# Patient Record
Sex: Male | Born: 1965 | Race: Black or African American | Hispanic: No | Marital: Single | Smoking: Never smoker
Health system: Southern US, Community
[De-identification: ages and names within clinical notes are randomized; demographics above are authoritative.]

## PROBLEM LIST (undated history)

## (undated) DIAGNOSIS — E7800 Pure hypercholesterolemia, unspecified: Secondary | ICD-10-CM

## (undated) DIAGNOSIS — G4730 Sleep apnea, unspecified: Secondary | ICD-10-CM

## (undated) DIAGNOSIS — I10 Essential (primary) hypertension: Secondary | ICD-10-CM

## (undated) DIAGNOSIS — E079 Disorder of thyroid, unspecified: Secondary | ICD-10-CM

## (undated) DIAGNOSIS — M51369 Other intervertebral disc degeneration, lumbar region without mention of lumbar back pain or lower extremity pain: Secondary | ICD-10-CM

## (undated) DIAGNOSIS — M5136 Other intervertebral disc degeneration, lumbar region: Secondary | ICD-10-CM

## (undated) HISTORY — DX: Morbid (severe) obesity due to excess calories: E66.01

---

## 2016-05-19 ENCOUNTER — Emergency Department
Admission: EM | Admit: 2016-05-19 | Discharge: 2016-05-19 | Disposition: A | Discharge status: Home / Self Care | Payer: Self-pay | Source: Home / Self Care | Attending: Family Medicine | Admitting: Family Medicine

## 2016-05-19 ENCOUNTER — Encounter: Payer: Self-pay | Admitting: Emergency Medicine

## 2016-05-19 ENCOUNTER — Emergency Department (INDEPENDENT_AMBULATORY_CARE_PROVIDER_SITE_OTHER): Payer: Self-pay

## 2016-05-19 DIAGNOSIS — M25471 Effusion, right ankle: Secondary | ICD-10-CM

## 2016-05-19 DIAGNOSIS — M25571 Pain in right ankle and joints of right foot: Secondary | ICD-10-CM

## 2016-05-19 DIAGNOSIS — M79671 Pain in right foot: Secondary | ICD-10-CM

## 2016-05-19 DIAGNOSIS — Z8739 Personal history of other diseases of the musculoskeletal system and connective tissue: Secondary | ICD-10-CM

## 2016-05-19 DIAGNOSIS — M7989 Other specified soft tissue disorders: Secondary | ICD-10-CM

## 2016-05-19 DIAGNOSIS — Z760 Encounter for issue of repeat prescription: Secondary | ICD-10-CM

## 2016-05-19 DIAGNOSIS — Z8639 Personal history of other endocrine, nutritional and metabolic disease: Secondary | ICD-10-CM

## 2016-05-19 DIAGNOSIS — M79674 Pain in right toe(s): Secondary | ICD-10-CM

## 2016-05-19 DIAGNOSIS — Z8679 Personal history of other diseases of the circulatory system: Secondary | ICD-10-CM

## 2016-05-19 HISTORY — DX: Other intervertebral disc degeneration, lumbar region: M51.36

## 2016-05-19 HISTORY — DX: Disorder of thyroid, unspecified: E07.9

## 2016-05-19 HISTORY — DX: Other intervertebral disc degeneration, lumbar region without mention of lumbar back pain or lower extremity pain: M51.369

## 2016-05-19 HISTORY — DX: Essential (primary) hypertension: I10

## 2016-05-19 HISTORY — DX: Pure hypercholesterolemia, unspecified: E78.00

## 2016-05-19 HISTORY — DX: Sleep apnea, unspecified: G47.30

## 2016-05-19 MED ORDER — VERAPAMIL HCL ER 240 MG PO TBCR: 240.0000 mg | EXTENDED_RELEASE_TABLET | Freq: Every day | ORAL | 0 refills | Status: DC

## 2016-05-19 MED ORDER — INDOMETHACIN 25 MG PO CAPS: 25.0000 mg | ORAL_CAPSULE | Freq: Three times a day (TID) | ORAL | 0 refills | Status: DC | PRN

## 2016-05-19 MED ORDER — HYDROCODONE-ACETAMINOPHEN 5-325 MG PO TABS: 1.0000 | ORAL_TABLET | ORAL | 0 refills | Status: DC | PRN

## 2016-05-19 MED ORDER — SIMVASTATIN 20 MG PO TABS: 20.0000 mg | ORAL_TABLET | Freq: Every day | ORAL | 0 refills | Status: DC

## 2016-05-19 MED ORDER — BENAZEPRIL HCL 40 MG PO TABS: 40.0000 mg | ORAL_TABLET | Freq: Every day | ORAL | 0 refills | Status: DC

## 2016-05-19 MED ORDER — HYDROCHLOROTHIAZIDE 25 MG PO TABS: 25.0000 mg | ORAL_TABLET | Freq: Every day | ORAL | 0 refills | Status: DC

## 2016-05-19 NOTE — ED Triage Notes (Deleted)
Addendum: Right ankle, foot, heel

## 2016-05-19 NOTE — ED Triage Notes (Addendum)
Right ankle, foot, heel, achilles pain, denies injury, hx of gout. Patient was laid off and does not have insurance  Is not taking any meds

## 2016-05-19 NOTE — ED Provider Notes (Signed)
CSN: [id number]     Arrival date & time 05/19/16  1452 History   First MD Initiated Contact with Patient 05/19/16 1517     Chief Complaint  Patient presents with  . Ankle Pain   (Consider location/radiation/quality/duration/timing/severity/associated sxs/prior Treatment) HPI Octavien Faggart is a 50 y.o. male presenting to UC with c/o 3 weeks of gradually worsening Right ankle and foot pain with swelling.  He notes he does do a lot of golfing and may have twisted it then but does not recall any specific injury.  He also reports hx of gout but states this is a different kind of pain.  Pain is aching and throbbing, worse with ambulation and palpation.  Pain is 6/10.  He has taken Tylenol Arthritis and Advil PM with only minimal temporary relief.  Denies burning pain like he normally has with gout.    BP in triage: 198/115. He reports known hx of HTN but also admits to not being on BP medication for about 2 years stating "it's been a while I know."  He does not have a PCP due to lack of insurance. Denies headache, chest pain or SOB.   Past Medical History:  Diagnosis Date  . DDD (degenerative disc disease), lumbar   . High cholesterol   . Hypertension   . Sleep apnea   . Thyroid disease    History reviewed. No pertinent surgical history. No family history on file. Social History  Substance Use Topics  . Smoking status: Never Smoker  . Smokeless tobacco: Never Used  . Alcohol use Yes    Review of Systems  Constitutional: Negative for chills, fatigue and fever.  Respiratory: Negative for chest tightness, shortness of breath, wheezing and stridor.   Cardiovascular: Positive for leg swelling (Right ankle). Negative for chest pain and palpitations.  Gastrointestinal: Negative for nausea and vomiting.  Musculoskeletal: Positive for arthralgias, gait problem, joint swelling and myalgias. Negative for back pain.  Skin: Negative for color change, rash and wound.  Neurological: Negative for  dizziness, weakness, light-headedness, numbness and headaches.    Allergies  Review of patient's allergies indicates no known allergies.  Home Medications   Prior to Admission medications   Medication Sig Start Date End Date Taking? Authorizing Provider  naproxen sodium (ANAPROX) 220 MG tablet Take 220 mg by mouth 2 (two) times daily with a meal.   Yes Historical Provider, MD  benazepril (LOTENSIN) 40 MG tablet Take 1 tablet (40 mg total) by mouth daily. 05/19/16   Junius Finner, PA-C  hydrochlorothiazide (HYDRODIURIL) 25 MG tablet Take 1 tablet (25 mg total) by mouth daily. 05/19/16   Junius Finner, PA-C  HYDROcodone-acetaminophen (NORCO/VICODIN) 5-325 MG tablet Take 1 tablet by mouth every 4 (four) hours as needed for moderate pain or severe pain. 05/19/16   Junius Finner, PA-C  indomethacin (INDOCIN) 25 MG capsule Take 1 capsule (25 mg total) by mouth 3 (three) times daily as needed for mild pain or moderate pain. For 5-7 days, then daily as needed for pain and swelling 05/19/16   Junius Finner, PA-C  simvastatin (ZOCOR) 20 MG tablet Take 1 tablet (20 mg total) by mouth daily. 05/19/16   Junius Finner, PA-C  verapamil (CALAN-SR) 240 MG CR tablet Take 1 tablet (240 mg total) by mouth at bedtime. 05/19/16   Junius Finner, PA-C   Meds Ordered and Administered this Visit  Medications - No data to display  BP (!) 198/115 (BP Location: Left Arm)   Pulse 97   Temp 98.3 F (  36.8 C) (Oral)   Ht 6' (1.829 m)   Wt 300 lb (136.1 kg)   SpO2 100%   BMI 40.69 kg/m  No data found.   Physical Exam  Constitutional: He is oriented to person, place, and time. He appears well-developed and well-nourished.  HENT:  Head: Normocephalic and atraumatic.  Eyes: EOM are normal.  Neck: Normal range of motion.  Cardiovascular: Normal rate.   Pulmonary/Chest: Effort normal.  Musculoskeletal: Normal range of motion. He exhibits edema and tenderness. He exhibits no deformity.       Feet:  Right ankle and foot:  moderate edema to medial aspect of ankle and medial dorsal aspect of foot. Tender to touch over edema, bottom of heel and mild tenderness to lateral aspect of ankle.  Full ROM ankle and toes. Calf is soft, non-tender.  Neurological: He is alert and oriented to person, place, and time.  Skin: Skin is warm and dry. Capillary refill takes less than 2 seconds. No rash noted. No erythema.  Right ankle and foot: skin in tact, no ecchymosis or erythema  Psychiatric: He has a normal mood and affect. His behavior is normal.  Nursing note and vitals reviewed.   Urgent Care Course   Clinical Course    Procedures (including critical care time)  Labs Review Labs Reviewed - No data to display  Imaging Review Dg Ankle Complete Right  Result Date: 05/19/2016 CLINICAL DATA:  Right foot and ankle swelling and discomfort for 3 weeks. No known injury. EXAM: RIGHT ANKLE - COMPLETE 3+ VIEW COMPARISON:  None. FINDINGS: Mild diffuse soft tissue swelling. Degenerative changes in the right ankle with joint space narrowing and spurring. No acute bony abnormality. Specifically, no fracture, subluxation, or dislocation. Soft tissues are intact. IMPRESSION: No acute bony abnormality. Electronically Signed   By: Charlett Nose M.D.   On: 05/19/2016 15:47   Dg Foot Complete Right  Result Date: 05/19/2016 CLINICAL DATA:  Right foot and ankle swelling and discomfort for 3 weeks. No known injury. EXAM: RIGHT FOOT COMPLETE - 3+ VIEW COMPARISON:  Ankle series performed today. FINDINGS: Mild degenerative changes at the first MTP joint with hallux valgus deformity. No acute bony abnormality. Specifically, no fracture, subluxation, or dislocation. Soft tissues are intact. IMPRESSION: No acute bony abnormality. Electronically Signed   By: Charlett Nose M.D.   On: 05/19/2016 15:48      MDM   1. Right ankle pain   2. Pain and swelling of right ankle   3. Pain and swelling of toe of right foot   4. History of gout   5. History  of hypertension   6. Medication refill    Pt c/o gradually worsening Right foot and ankle pain and swelling. Not known injury. Hx of gout.  Tenderness and swelling on exam w/o redness, warmth, or ecchymosis to skin.  Plain films negative for acute bony abnormality.  Will place pt in a boot for comfort. Rx: indomethacin and norco for pain and swelling. BP and cholesterol meds refilled.  strongly encouraged f/u with Dr. Denyse Amass, Primary Care/Sports Medicine in 1 week for BP recheck and recheck of foot and ankle pain and swelling. Stressed importance of having BP monitored regularly. Pt and his mother verbalized understanding and agreement with tx plan.     Junius Finner, PA-C 05/19/16 1706

## 2016-06-17 ENCOUNTER — Ambulatory Visit (INDEPENDENT_AMBULATORY_CARE_PROVIDER_SITE_OTHER): Payer: Self-pay | Admitting: Family Medicine

## 2016-06-17 ENCOUNTER — Encounter: Payer: Self-pay | Admitting: Family Medicine

## 2016-06-17 VITALS — BP 160/76 | HR 97 | Ht 72.0 in | Wt 303.0 lb

## 2016-06-17 DIAGNOSIS — M109 Gout, unspecified: Secondary | ICD-10-CM | POA: Insufficient documentation

## 2016-06-17 DIAGNOSIS — I10 Essential (primary) hypertension: Secondary | ICD-10-CM | POA: Insufficient documentation

## 2016-06-17 DIAGNOSIS — M10079 Idiopathic gout, unspecified ankle and foot: Secondary | ICD-10-CM

## 2016-06-17 LAB — BASIC METABOLIC PANEL
BUN: 22 mg/dL (ref 7–25)
CALCIUM: 9.4 mg/dL (ref 8.6–10.3)
CO2: 26 mmol/L (ref 20–31)
CREATININE: 1.84 mg/dL — AB (ref 0.70–1.33)
Chloride: 103 mmol/L (ref 98–110)
Glucose, Bld: 83 mg/dL (ref 65–99)
Potassium: 3.7 mmol/L (ref 3.5–5.3)
Sodium: 141 mmol/L (ref 135–146)

## 2016-06-17 MED ORDER — VERAPAMIL HCL ER 240 MG PO TBCR: 240.0000 mg | EXTENDED_RELEASE_TABLET | Freq: Every day | ORAL | 0 refills | Status: DC

## 2016-06-17 MED ORDER — BENAZEPRIL HCL 40 MG PO TABS: 40.0000 mg | ORAL_TABLET | Freq: Every day | ORAL | 0 refills | Status: DC

## 2016-06-17 MED ORDER — HYDROCHLOROTHIAZIDE 25 MG PO TABS: 25.0000 mg | ORAL_TABLET | Freq: Every day | ORAL | 0 refills | Status: DC

## 2016-06-17 MED ORDER — INDOMETHACIN 25 MG PO CAPS: 25.0000 mg | ORAL_CAPSULE | Freq: Three times a day (TID) | ORAL | 0 refills | Status: DC | PRN

## 2016-06-17 NOTE — Patient Instructions (Signed)
Thank you for coming in today. Get labs.  Start medicine.  Return soon in a month or so.  Apply for the [facility].  Call or go to the ER if you develop a large red swollen joint with extreme pain or oozing puss.      Gout Gout is an inflammatory arthritis caused by a buildup of uric acid crystals in the joints. Uric acid is a chemical that is normally present in the blood. When the level of uric acid in the blood is too high it can form crystals that deposit in your joints and tissues. This causes joint redness, soreness, and swelling (inflammation). Repeat attacks are common. Over time, uric acid crystals can form into masses (tophi) near a joint, destroying bone and causing disfigurement. Gout is treatable and often preventable. CAUSES  The disease begins with elevated levels of uric acid in the blood. Uric acid is produced by your body when it breaks down a naturally found substance called purines. Certain foods you eat, such as meats and fish, contain high amounts of purines. Causes of an elevated uric acid level include:  Being passed down from parent to child (heredity).  Diseases that cause increased uric acid production (such as obesity, psoriasis, and certain cancers).  Excessive alcohol use.  Diet, especially diets rich in meat and seafood.  Medicines, including certain cancer-fighting medicines (chemotherapy), water pills (diuretics), and aspirin.  Chronic kidney disease. The kidneys are no longer able to remove uric acid well.  Problems with metabolism. Conditions strongly associated with gout include:  Obesity.  High blood pressure.  High cholesterol.  Diabetes. Not everyone with elevated uric acid levels gets gout. It is not understood why some people get gout and others do not. Surgery, joint injury, and eating too much of certain foods are some of the factors that can lead to gout attacks. SYMPTOMS   An attack of gout comes on quickly. It causes intense pain  with redness, swelling, and warmth in a joint.  Fever can occur.  Often, only one joint is involved. Certain joints are more commonly involved:  Base of the big toe.  Knee.  Ankle.  Wrist.  Finger. Without treatment, an attack usually goes away in a few days to weeks. Between attacks, you usually will not have symptoms, which is different from many other forms of arthritis. DIAGNOSIS  Your caregiver will suspect gout based on your symptoms and exam. In some cases, tests may be recommended. The tests may include:  Blood tests.  Urine tests.  X-rays.  Joint fluid exam. This exam requires a needle to remove fluid from the joint (arthrocentesis). Using a microscope, gout is confirmed when uric acid crystals are seen in the joint fluid. TREATMENT  There are two phases to gout treatment: treating the sudden onset (acute) attack and preventing attacks (prophylaxis).  Treatment of an Acute Attack.  Medicines are used. These include anti-inflammatory medicines or steroid medicines.  An injection of steroid medicine into the affected joint is sometimes necessary.  The painful joint is rested. Movement can worsen the arthritis.  You may use warm or cold treatments on painful joints, depending which works best for you.  Treatment to Prevent Attacks.  If you suffer from frequent gout attacks, your caregiver may advise preventive medicine. These medicines are started after the acute attack subsides. These medicines either help your kidneys eliminate uric acid from your body or decrease your uric acid production. You may need to stay on these medicines for  a very long time.  The early phase of treatment with preventive medicine can be associated with an increase in acute gout attacks. For this reason, during the first few months of treatment, your caregiver may also advise you to take medicines usually used for acute gout treatment. Be sure you understand your caregiver's directions. Your  caregiver may make several adjustments to your medicine dose before these medicines are effective.  Discuss dietary treatment with your caregiver or dietitian. Alcohol and drinks high in sugar and fructose and foods such as meat, poultry, and seafood can increase uric acid levels. Your caregiver or dietitian can advise you on drinks and foods that should be limited. HOME CARE INSTRUCTIONS   Do not take aspirin to relieve pain. This raises uric acid levels.  Only take over-the-counter or prescription medicines for pain, discomfort, or fever as directed by your caregiver.  Rest the joint as much as possible. When in bed, keep sheets and blankets off painful areas.  Keep the affected joint raised (elevated).  Apply warm or cold treatments to painful joints. Use of warm or cold treatments depends on which works best for you.  Use crutches if the painful joint is in your leg.  Drink enough fluids to keep your urine clear or pale yellow. This helps your body get rid of uric acid. Limit alcohol, sugary drinks, and fructose drinks.  Follow your dietary instructions. Pay careful attention to the amount of protein you eat. Your daily diet should emphasize fruits, vegetables, whole grains, and fat-free or low-fat milk products. Discuss the use of coffee, vitamin C, and cherries with your caregiver or dietitian. These may be helpful in lowering uric acid levels.  Maintain a healthy body weight. SEEK MEDICAL CARE IF:   You develop diarrhea, vomiting, or any side effects from medicines.  You do not feel better in 24 hours, or you are getting worse. SEEK IMMEDIATE MEDICAL CARE IF:   Your joint becomes suddenly more tender, and you have chills or a fever. MAKE SURE YOU:   Understand these instructions.  Will watch your condition.  Will get help right away if you are not doing well or get worse.   This information is not intended to replace advice given to you by your health care provider. Make  sure you discuss any questions you have with your health care provider.   Document Released: 10/01/2000 Document Revised: 10/25/2014 Document Reviewed: 05/17/2012 Elsevier Interactive Patient Education [employer]

## 2016-06-17 NOTE — Progress Notes (Signed)
Subjective:    I'm seeing this patient as a consultation for:  Dr. Donna ChristenStephen Beese  CC: Right ankle pain  HPI: 50 year old male with a history of gout presents with right ankle pain and swelling for the past 2 months.  He describes the pain as sharp and claims his ankle feels hot.  He claims his ankle hurts when he puts on socks or shoes.  Patient went to urgent care on 05/19/16 and had a negative Xray.  At that time it was thought to be a gout flare and he was prescribed Indomethacin and Norco.  His pain was improving until last week when his Indomethacin ran out.  He reports the pain feels like his usual gout pain.  Denies injury.  Over the past week he has developed similar pain in his left ankle.  No other complaints.  Patient has a history of hypertension and currently takes benzapril 40 mg daily, HCTZ 25 mg daily, and verapamil 240 mg daily.  His BP was 160/76.  He doesn't check his blood pressure at home.  Denies chest pain, shortness of breath, palpitations, and syncope.     Past medical history, Surgical history, Family history not pertinant except as noted below, Social history, Allergies, and medications have been entered into the medical record, reviewed, and no changes needed.   Review of Systems: No headache, visual changes, nausea, vomiting, diarrhea, constipation, dizziness, abdominal pain, skin rash, fevers, chills, night sweats, weight loss, swollen lymph nodes, body aches, muscle aches, chest pain, shortness of breath, mood changes, visual or auditory hallucinations.   Objective:    Vitals:   06/17/16 0953  BP: (!) 160/76  Pulse: 97   General: Well Developed, well nourished, and in no acute distress.  Neuro/Psych: Alert and oriented x3, extra-ocular muscles intact, able to move all 4 extremities, sensation grossly intact. Skin: Warm and dry, no rashes noted.  Respiratory: Not using accessory muscles, speaking in full sentences, trachea midline.  Cardiovascular: Pulses  palpable, no extremity edema. Abdomen: Does not appear distended. MSK: Right ankle: Swollen over the medial malleolus, worse than left. Mildly tender over the medial malleolus.  Warm to the touch Full range of motion Strength, sensation, and pulses intact  Left ankle:Swollen over the medial malleolus. Mildly tender over the medial malleolus.  Full range of motion Strength, sensation, and pulses intact  Procedure: Real-time Ultrasound Guided Injection of Right ankle  Device: GE Logiq E  Images permanently stored and available for review in the ultrasound unit. Verbal informed consent obtained. Discussed risks and benefits of procedure. Warned about infection bleeding damage to structures skin hypopigmentation and fat atrophy among others. Patient expresses understanding and agreement Time-out conducted.  Noted no overlying erythema, induration, or other signs of local infection.  Skin prepped in a sterile fashion.  Local anesthesia: Topical Ethyl chloride.  With sterile technique and under real time ultrasound guidance: 40 mg of Kenalog and 3 mL of Marcaine injected easily.  Completed without difficulty  Pain immediately resolved suggesting accurate placement of the medication.  Advised to call if fevers/chills, erythema, induration, drainage, or persistent bleeding.  Images permanently stored and available for review in the ultrasound unit.  Impression: Technically successful ultrasound guided injection.  Procedure: Real-time Ultrasound Guided Injection of left ankle  Device: GE Logiq E  Images permanently stored and available for review in the ultrasound unit. Verbal informed consent obtained. Discussed risks and benefits of procedure. Warned about infection bleeding damage to structures skin hypopigmentation and fat  atrophy among others. Patient expresses understanding and agreement Time-out conducted.  Noted no overlying erythema, induration, or other signs of  local infection.  Skin prepped in a sterile fashion.  Local anesthesia: Topical Ethyl chloride.  With sterile technique and under real time ultrasound guidance: 40 mg of Kenalog and 3 mL of Marcaine injected easily.  Completed without difficulty  Pain immediately resolved suggesting accurate placement of the medication.  Advised to call if fevers/chills, erythema, induration, drainage, or persistent bleeding.  Images permanently stored and available for review in the ultrasound unit.  Impression: Technically successful ultrasound guided injection.  Lot number Kenalog: [id number] Marcaine: [id number]    No results found for this or any previous visit (from the past 24 hour(s)). No results found.  Impression and Recommendations:    Assessment and Plan: 50 y.o. male with bilateral ankle pain and swelling, likely from chronic gout.  His care is complicated by lack of insurance.  He will apply for the [facility].   - Bilateral ankle steroid injections - Indomethacin 25 mg daily - BMP to check kidney function.   Hypertension: Elevated to 160/76 today.   Refill blood pressure medications - BMP to check kidney function - Recheck at next visit   Discussed warning signs or symptoms. Please see discharge instructions. Patient expresses understanding.

## 2016-06-18 ENCOUNTER — Encounter: Payer: Self-pay | Admitting: Family Medicine

## 2016-06-18 DIAGNOSIS — R7989 Other specified abnormal findings of blood chemistry: Secondary | ICD-10-CM | POA: Insufficient documentation

## 2016-07-15 ENCOUNTER — Encounter: Payer: Self-pay | Admitting: Family Medicine

## 2016-07-15 ENCOUNTER — Ambulatory Visit (INDEPENDENT_AMBULATORY_CARE_PROVIDER_SITE_OTHER): Payer: Self-pay | Admitting: Family Medicine

## 2016-07-15 VITALS — BP 175/95 | HR 81 | Wt 298.0 lb

## 2016-07-15 DIAGNOSIS — I10 Essential (primary) hypertension: Secondary | ICD-10-CM

## 2016-07-15 DIAGNOSIS — M109 Gout, unspecified: Secondary | ICD-10-CM

## 2016-07-15 DIAGNOSIS — M10079 Idiopathic gout, unspecified ankle and foot: Secondary | ICD-10-CM

## 2016-07-15 DIAGNOSIS — R748 Abnormal levels of other serum enzymes: Secondary | ICD-10-CM

## 2016-07-15 DIAGNOSIS — R7989 Other specified abnormal findings of blood chemistry: Secondary | ICD-10-CM

## 2016-07-15 LAB — BASIC METABOLIC PANEL WITH GFR
BUN: 13 mg/dL (ref 7–25)
CO2: 23 mmol/L (ref 20–31)
Calcium: 9.4 mg/dL (ref 8.6–10.3)
Chloride: 104 mmol/L (ref 98–110)
Creat: 1.51 mg/dL — ABNORMAL HIGH (ref 0.70–1.33)
GFR, EST AFRICAN AMERICAN: 61 mL/min (ref >=60)
GFR, EST NON AFRICAN AMERICAN: 53 mL/min — AB (ref >=60)
GLUCOSE: 99 mg/dL (ref 65–99)
POTASSIUM: 3.8 mmol/L (ref 3.5–5.3)
Sodium: 139 mmol/L (ref 135–146)

## 2016-07-15 MED ORDER — ATENOLOL 50 MG PO TABS: 50.0000 mg | ORAL_TABLET | Freq: Every day | ORAL | 0 refills | Status: DC

## 2016-07-15 MED ORDER — VERAPAMIL HCL ER 240 MG PO TBCR: 240.0000 mg | EXTENDED_RELEASE_TABLET | Freq: Every day | ORAL | 0 refills | Status: DC

## 2016-07-15 MED ORDER — BENAZEPRIL HCL 40 MG PO TABS: 40.0000 mg | ORAL_TABLET | Freq: Every day | ORAL | 0 refills | Status: DC

## 2016-07-15 MED ORDER — HYDROCHLOROTHIAZIDE 25 MG PO TABS: 25.0000 mg | ORAL_TABLET | Freq: Every day | ORAL | 0 refills | Status: DC

## 2016-07-15 MED ORDER — SIMVASTATIN 20 MG PO TABS: 20.0000 mg | ORAL_TABLET | Freq: Every day | ORAL | 0 refills | Status: DC

## 2016-07-15 NOTE — Patient Instructions (Signed)
Thank you for coming in today. Continue current medicines.  Add Atenolol daily.  Recheck in 3 months.  Get labs today.  Let me know if you feel bad or need a refill of indomethacin. I can call in indomethacin.   Call or go to the emergency room if you get worse, have trouble breathing, have chest pains, or palpitations.

## 2016-07-15 NOTE — Progress Notes (Signed)
       Zachary Ibarra is a 50 y.o. [name] today for follow up of chronic gout and hypertension.  His care is complicated by lack of insurance    1.  Hypertension:  Tolerating his medications well.  He does not take his blood pressure at home.  Denies chest pain, shortness of breath, dizziness, and palpitations.  Of note he has lost 5 pounds since his last visit.    2.  Chronic gout: He received bilateral ankle injections at his last visit.  He only reports one mild flare of gout in his left big toe a few days ago but claims he is back to baseline.  He is tolerating the Indomethacin well.    Past Medical History:  Diagnosis Date  . DDD (degenerative disc disease), lumbar   . High cholesterol   . Hypertension   . Sleep apnea   . Thyroid disease    No past surgical history on file. Social History  Substance Use Topics  . Smoking status: Never Smoker  . Smokeless tobacco: Never Used  . Alcohol use Yes   family history is not on file.  ROS as above:  Medications: Current Outpatient Prescriptions  Medication Sig Dispense Refill  . benazepril (LOTENSIN) 40 MG tablet Take 1 tablet (40 mg total) by mouth daily. 30 tablet 0  . hydrochlorothiazide (HYDRODIURIL) 25 MG tablet Take 1 tablet (25 mg total) by mouth daily. 30 tablet 0  . indomethacin (INDOCIN) 25 MG capsule Take 1 capsule (25 mg total) by mouth 3 (three) times daily as needed for mild pain or moderate pain. For 5-7 days, then daily as needed for pain and swelling 60 capsule 0  . simvastatin (ZOCOR) 20 MG tablet Take 1 tablet (20 mg total) by mouth daily. 30 tablet 0  . verapamil (CALAN-SR) 240 MG CR tablet Take 1 tablet (240 mg total) by mouth at bedtime. 30 tablet 0   No current facility-administered medications for this visit.    No Known Allergies   Exam:  BP (!) 175/95   Pulse  81   Wt 298 lb (135.2 kg)   BMI 40.42 kg/m  Gen: Well NAD Lungs: Normal work of breathing. CTABL Heart: RRR no MRG Exts: Brisk capillary refill, warm and well perfused.   No results found for this or any previous visit (from the past 24 hour(s)). No results found.    Assessment and Plan: 50 y.o. male with:  1.  Hypertension:  BP poorly controlled with current regimen.  Choice of BP medication limited due to cost from lack of insurance.  - BMP to check K+ and creatinine - Start  Atenolol 50 mg daily  2.  Chronic gout:  Doing well on Indomethacin.  Will hold off on allopurinol due to cost and kidney function.  Patient is in agreement with this plan.   - Recheck in 3 months  3.  Health maintenance:  Patient is concerned about cost of these tests.  Will hold off until patient gets charity care or insurance.     No orders of the defined types were placed in this encounter.   Discussed warning signs or symptoms. Please see discharge instructions. Patient expresses understanding.

## 2016-08-20 ENCOUNTER — Other Ambulatory Visit: Payer: Self-pay | Admitting: Family Medicine

## 2016-10-08 ENCOUNTER — Ambulatory Visit: Payer: Self-pay | Admitting: Family Medicine

## 2016-10-29 ENCOUNTER — Ambulatory Visit (INDEPENDENT_AMBULATORY_CARE_PROVIDER_SITE_OTHER): Payer: Self-pay | Admitting: Family Medicine

## 2016-10-29 ENCOUNTER — Encounter: Payer: Self-pay | Admitting: Family Medicine

## 2016-10-29 DIAGNOSIS — I10 Essential (primary) hypertension: Secondary | ICD-10-CM

## 2016-10-29 DIAGNOSIS — R7989 Other specified abnormal findings of blood chemistry: Secondary | ICD-10-CM

## 2016-10-29 DIAGNOSIS — M109 Gout, unspecified: Secondary | ICD-10-CM

## 2016-10-29 MED ORDER — INDOMETHACIN 25 MG PO CAPS
ORAL_CAPSULE | ORAL | 0 refills | Status: DC
Start: 2016-10-29 — End: 2017-02-04

## 2016-10-29 MED ORDER — ATENOLOL 100 MG PO TABS: 100.0000 mg | ORAL_TABLET | Freq: Every day | ORAL | 0 refills | Status: DC

## 2016-10-29 NOTE — Progress Notes (Signed)
       Zachary Ibarra is a 51 y.o. male who presents to [name] today for hypertension. Patient takes the medications listed below with the exception of atenolol 50 mg daily. He denies chest pain palpitations or shortness of breath. When he checks his blood pressure at home he notes his numbers are typically in the 150s range as they are today. He has his heart rate is typically in the 70s. He denies any lightheadedness or dizziness.   He denies any gout flares as well.  He notes he tolerates the simvastatin well with no muscle aches or pains.   Past Medical History:  Diagnosis Date  . DDD (degenerative disc disease), lumbar   . High cholesterol   . Hypertension   . Sleep apnea   . Thyroid disease    No past surgical history on file. Social History  Substance Use Topics  . Smoking status: Never Smoker  . Smokeless tobacco: Never Used  . Alcohol use Yes   family history is not on file.  ROS as above:  Medications: Current Outpatient Prescriptions  Medication Sig Dispense Refill  . atenolol (TENORMIN) 100 MG tablet Take 1 tablet (100 mg total) by mouth daily. 90 tablet 0  . benazepril (LOTENSIN) 40 MG tablet Take 1 tablet (40 mg total) by mouth daily. 90 tablet 0  . hydrochlorothiazide (HYDRODIURIL) 25 MG tablet Take 1 tablet (25 mg total) by mouth daily. 90 tablet 0  . indomethacin (INDOCIN) 25 MG capsule take 1 capsule by mouth three times a day if needed for pain FOR 5-7 DAYS THEN AS NEEDED FOR PAIN AND SWELLING 60 capsule 0  . simvastatin (ZOCOR) 20 MG tablet Take 1 tablet (20 mg total) by mouth daily. 90 tablet 0  . verapamil (CALAN-SR) 240 MG CR tablet Take 1 tablet (240 mg total) by mouth at bedtime. 90 tablet 0   No current facility-administered medications for this visit.    No Known Allergies  Health Maintenance Health Maintenance  Topic Date Due   . Hepatitis C Screening  02/03/1966  . HIV Screening  07/22/1979  . TETANUS/TDAP  07/22/1983  . COLONOSCOPY  07/21/2014  . INFLUENZA VACCINE  05/18/2016     Exam:  BP (!) 154/83   Wt (!) 308 lb (139.7 kg)   BMI 41.77 kg/m  Gen: Well NAD HEENT: EOMI,  MMM Lungs: Normal work of breathing. CTABL Heart: RRR no MRG Abd: NABS, Soft. Nondistended, Nontender Exts: Brisk capillary refill, warm and well perfused.    No results found for this or any previous visit (from the past 72 hour(s)). No results found.    Assessment and Plan: 51 y.o. male with  Hypertension: Not a goal. Increase atenolol to 100 mg daily. Continue other regimen. Recheck in 3 months.  Gout: Doing well use indomethacin as needed.  Hyperlipidemia: Doing well with simvastatin.  Recheck in 3 months.   No orders of the defined types were placed in this encounter.   Discussed warning signs or symptoms. Please see discharge instructions. Patient expresses understanding.

## 2016-10-29 NOTE — Patient Instructions (Signed)
Thank you for coming in today. Increase atenolol to 100mg  daily.  Work on weight loss.  Consider a low carb diet.  The Zone Diet or Slow Carb diet  I also recommend using a calorie counting application like Myfitnesspal  Recheck in 3 months.

## 2017-01-27 ENCOUNTER — Ambulatory Visit: Payer: Self-pay | Admitting: Family Medicine

## 2017-02-04 ENCOUNTER — Encounter: Payer: Self-pay | Admitting: Family Medicine

## 2017-02-04 ENCOUNTER — Ambulatory Visit (INDEPENDENT_AMBULATORY_CARE_PROVIDER_SITE_OTHER): Payer: Self-pay | Admitting: Family Medicine

## 2017-02-04 DIAGNOSIS — I10 Essential (primary) hypertension: Secondary | ICD-10-CM

## 2017-02-04 DIAGNOSIS — M109 Gout, unspecified: Secondary | ICD-10-CM

## 2017-02-04 DIAGNOSIS — R7989 Other specified abnormal findings of blood chemistry: Secondary | ICD-10-CM

## 2017-02-04 LAB — BASIC METABOLIC PANEL WITH GFR
BUN: 22 mg/dL (ref 7–25)
CALCIUM: 9.3 mg/dL (ref 8.6–10.3)
CO2: 24 mmol/L (ref 20–31)
CREATININE: 1.97 mg/dL — AB (ref 0.70–1.33)
Chloride: 105 mmol/L (ref 98–110)
GFR, EST AFRICAN AMERICAN: 44 mL/min — AB (ref >=60)
GFR, Est Non African American: 38 mL/min — ABNORMAL LOW (ref >=60)
GLUCOSE: 100 mg/dL — AB (ref 65–99)
POTASSIUM: 4.2 mmol/L (ref 3.5–5.3)
Sodium: 139 mmol/L (ref 135–146)

## 2017-02-04 MED ORDER — HYDROCHLOROTHIAZIDE 25 MG PO TABS: 25.0000 mg | ORAL_TABLET | Freq: Every day | ORAL | 2 refills | Status: DC

## 2017-02-04 MED ORDER — DICLOFENAC SODIUM 1 % TD GEL: 2.0000 g | Freq: Four times a day (QID) | TRANSDERMAL | 11 refills | Status: DC

## 2017-02-04 MED ORDER — VERAPAMIL HCL ER 240 MG PO TBCR: 240.0000 mg | EXTENDED_RELEASE_TABLET | Freq: Every day | ORAL | 0 refills | Status: DC

## 2017-02-04 MED ORDER — BENAZEPRIL HCL 40 MG PO TABS: 40.0000 mg | ORAL_TABLET | Freq: Every day | ORAL | 2 refills | Status: DC

## 2017-02-04 MED ORDER — SIMVASTATIN 20 MG PO TABS: 20.0000 mg | ORAL_TABLET | Freq: Every day | ORAL | 2 refills | Status: DC

## 2017-02-04 MED ORDER — INDOMETHACIN 25 MG PO CAPS
ORAL_CAPSULE | ORAL | 2 refills | Status: DC
Start: 2017-02-04 — End: 2017-08-22

## 2017-02-04 MED ORDER — ATENOLOL 100 MG PO TABS: 100.0000 mg | ORAL_TABLET | Freq: Every day | ORAL | 2 refills | Status: DC

## 2017-02-04 MED ORDER — SPIRONOLACTONE 25 MG PO TABS: 25.0000 mg | ORAL_TABLET | Freq: Every day | ORAL | 0 refills | Status: DC

## 2017-02-04 NOTE — Patient Instructions (Addendum)
Thank you for coming in today.  Continue current medications.  Add spironolactone daily.   Return midweek for a nurse visit.  At that time we will get labs as well.   Return sooner if needed.   Call or go to the emergency room if you get worse, have trouble breathing, have chest pains, or palpitations.   Use voltaren gel for the hand as needed.    Spironolactone tablets What is this medicine? SPIRONOLACTONE (speer on [place] LAK tone) is a diuretic. It helps you make more urine and to lose excess water from your body. This medicine is used to treat high blood pressure, and edema or swelling from heart, kidney, or liver disease. It is also used to treat patients who make too much aldosterone or have low potassium. This medicine may be used for other purposes; ask your health care provider or pharmacist if you have questions. COMMON BRAND NAME(S): Aldactone What should I tell my health care provider before I take this medicine? They need to know if you have any of these conditions: -high blood level of potassium -kidney disease or trouble making urine -liver disease -an unusual or allergic reaction to spironolactone, other medicines, foods, dyes, or preservatives -pregnant or trying to get pregnant -breast-feeding How should I use this medicine? Take this medicine by mouth with a drink of water. Follow the directions on your prescription label. You can take it with or without food. If it upsets your stomach, take it with food. Do not take your medicine more often than directed. Remember that you will need to pass more urine after taking this medicine. Do not take your doses at a time of day that will cause you problems. Do not take at bedtime. Talk to your pediatrician regarding the use of this medicine in children. While this drug may be prescribed for selected conditions, precautions do apply. Overdosage: If you think you have taken too much of this medicine contact a poison control center  or emergency room at once. NOTE: This medicine is only for you. Do not share this medicine with others. What if I miss a dose? If you miss a dose, take it as soon as you can. If it is almost time for your next dose, take only that dose. Do not take double or extra doses. What may interact with this medicine? Do not take this medicine with any of the following medications: -eplerenone This medicine may also interact with the following medications: -corticosteroids -digoxin -lithium -medicines for high blood pressure like ACE inhibitors -skeletal muscle relaxants like tubocurarine -NSAIDs, medicines for pain and inflammation, like ibuprofen or naproxen -potassium products like salt substitute or supplements -pressor amines like norepinephrine -some diuretics This list may not describe all possible interactions. Give your health care provider a list of all the medicines, herbs, non-prescription drugs, or dietary supplements you use. Also tell them if you smoke, drink alcohol, or use illegal drugs. Some items may interact with your medicine. What should I watch for while using this medicine? Visit your doctor or health care professional for regular checks on your progress. Check your blood pressure as directed. Ask your doctor what your blood pressure should be, and when you should contact them. You may need to be on a special diet while taking this medicine. Ask your doctor. Also, ask how many glasses of fluid you need to drink a day. You must not get dehydrated. This medicine may make you feel confused, dizzy or lightheaded. Drinking alcohol and taking  some medicines can make this worse. Do not drive, use machinery, or do anything that needs mental alertness until you know how this medicine affects you. Do not sit or stand up quickly. What side effects may I notice from receiving this medicine? Side effects that you should report to your doctor or health care professional as soon as  possible: -allergic reactions such as skin rash or itching, hives, swelling of the lips, mouth, tongue, or throat -black or tarry stools -fast, irregular heartbeat -fever -muscle pain, cramps -numbness, tingling in hands or feet -trouble breathing -trouble passing urine -unusual bleeding -unusually weak or tired Side effects that usually do not require medical attention (report to your doctor or health care professional if they continue or are bothersome): -change in voice or hair growth -confusion -dizzy, drowsy -dry mouth, increased thirst -enlarged or tender breasts -headache -irregular menstrual periods -sexual difficulty, unable to have an erection -stomach upset This list may not describe all possible side effects. Call your doctor for medical advice about side effects. You may report side effects to FDA at 1-800-FDA-1088. Where should I keep my medicine? Keep out of the reach of children. Store below 25 degrees C (77 degrees F). Throw away any unused medicine after the expiration date. NOTE: This sheet is a summary. It may not cover all possible information. If you have questions about this medicine, talk to your doctor, pharmacist, or health care provider.  2018 Elsevier/Gold Standard (2010-06-16 12:51:30)

## 2017-02-04 NOTE — Progress Notes (Signed)
Zachary Ibarra is a 51 y.o. male who presents to [facility]: [facility] today for follow up HTN, and obesity.   Zachary Ibarra on hydrochlorothiazide and verapamil for blood pressure. She denies chest pain palpitations or shortness of breath. He does note however that his blood pressure remains elevated. He has checked his blood pressure units in the 160s at times. He continues to not have health insurance and would like to avoid excessive testing due to healthcare cost. He feels well with no fevers or chills.   Past Medical History:  Diagnosis Date  . DDD (degenerative disc disease), lumbar   . High cholesterol   . Hypertension   . Sleep apnea   . Thyroid disease    No past surgical history on file. Social History  Substance Use Topics  . Smoking status: Never Smoker  . Smokeless tobacco: Never Used  . Alcohol use Yes   family history is not on file.  ROS as above:  Medications: Current Outpatient Prescriptions  Medication Sig Dispense Refill  . verapamil (CALAN-SR) 240 MG CR tablet Take 1 tablet (240 mg total) by mouth at bedtime. 90 tablet 0  . atenolol (TENORMIN) 100 MG tablet Take 1 tablet (100 mg total) by mouth daily. 90 tablet 2  . benazepril (LOTENSIN) 40 MG tablet Take 1 tablet (40 mg total) by mouth daily. 90 tablet 2  . diclofenac sodium (VOLTAREN) 1 % GEL Apply 2 g topically 4 (four) times daily. To affected joint. 100 g 11  . hydrochlorothiazide (HYDRODIURIL) 25 MG tablet Take 1 tablet (25 mg total) by mouth daily. 90 tablet 2  . indomethacin (INDOCIN) 25 MG capsule take 1 capsule by mouth three times a day if needed for pain FOR 5-7 DAYS THEN AS NEEDED FOR PAIN AND SWELLING 60 capsule 2  . simvastatin (ZOCOR) 20 MG tablet Take 1 tablet (20 mg total) by mouth daily. 90 tablet 2  . spironolactone (ALDACTONE) 25 MG tablet Take 1 tablet (25 mg total) by mouth  daily. 30 tablet 0   No current facility-administered medications for this visit.    No Known Allergies  Health Maintenance Health Maintenance  Topic Date Due  . Hepatitis C Screening  April 05, 1966  . HIV Screening  07/22/1979  . TETANUS/TDAP  07/22/1983  . COLONOSCOPY  07/21/2014  . INFLUENZA VACCINE  05/18/2017     Exam:  BP (!) 169/95   Pulse 62   Wt (!) 316 lb (143.3 kg)   BMI 42.86 kg/m   Wt Readings from Last 10 Encounters:  02/04/17 (!) 316 lb (143.3 kg)  10/29/16 (!) 308 lb (139.7 kg)  07/15/16 298 lb (135.2 kg)  06/17/16 (!) 303 lb (137.4 kg)  05/19/16 300 lb (136.1 kg)    Gen: Well NAD HEENT: EOMI,  MMM Lungs: Normal work of breathing. CTABL Heart: RRR no MRG Abd: NABS, Soft. Nondistended, Nontender Exts: Brisk capillary refill, warm and well perfused.    No results found for this or any previous visit (from the past 72 hour(s)). No results found.    Assessment and Plan: 51 y.o. male with hypertension. Not well controlled. Add spironolactone and check metabolic panel. Patient will return to clinic next week for nurse visit. At that time will check blood pressure as well as metabolic panel again.. Follow up with me in a month or so.    Orders Placed This Encounter  Procedures  . BASIC METABOLIC PANEL WITH GFR  Meds ordered this encounter  Medications  . atenolol (TENORMIN) 100 MG tablet    Sig: Take 1 tablet (100 mg total) by mouth daily.    Dispense:  90 tablet    Refill:  2  . benazepril (LOTENSIN) 40 MG tablet    Sig: Take 1 tablet (40 mg total) by mouth daily.    Dispense:  90 tablet    Refill:  2  . hydrochlorothiazide (HYDRODIURIL) 25 MG tablet    Sig: Take 1 tablet (25 mg total) by mouth daily.    Dispense:  90 tablet    Refill:  2  . indomethacin (INDOCIN) 25 MG capsule    Sig: take 1 capsule by mouth three times a day if needed for pain FOR 5-7 DAYS THEN AS NEEDED FOR PAIN AND SWELLING    Dispense:  60 capsule    Refill:  2  .  simvastatin (ZOCOR) 20 MG tablet    Sig: Take 1 tablet (20 mg total) by mouth daily.    Dispense:  90 tablet    Refill:  2  . verapamil (CALAN-SR) 240 MG CR tablet    Sig: Take 1 tablet (240 mg total) by mouth at bedtime.    Dispense:  90 tablet    Refill:  0  . spironolactone (ALDACTONE) 25 MG tablet    Sig: Take 1 tablet (25 mg total) by mouth daily.    Dispense:  30 tablet    Refill:  0  . diclofenac sodium (VOLTAREN) 1 % GEL    Sig: Apply 2 g topically 4 (four) times daily. To affected joint.    Dispense:  100 g    Refill:  11     Discussed warning signs or symptoms. Please see discharge instructions. Patient expresses understanding.

## 2017-02-09 ENCOUNTER — Ambulatory Visit: Payer: Self-pay

## 2017-02-11 ENCOUNTER — Ambulatory Visit: Payer: Self-pay

## 2017-04-17 ENCOUNTER — Other Ambulatory Visit: Payer: Self-pay | Admitting: Family Medicine

## 2017-06-22 ENCOUNTER — Other Ambulatory Visit: Payer: Self-pay | Admitting: Family Medicine

## 2017-08-22 ENCOUNTER — Other Ambulatory Visit: Payer: Self-pay | Admitting: Family Medicine

## 2017-08-30 ENCOUNTER — Other Ambulatory Visit: Payer: Self-pay | Admitting: Family Medicine

## 2017-08-30 DIAGNOSIS — R7989 Other specified abnormal findings of blood chemistry: Secondary | ICD-10-CM

## 2017-08-30 DIAGNOSIS — M109 Gout, unspecified: Secondary | ICD-10-CM

## 2017-08-30 DIAGNOSIS — I10 Essential (primary) hypertension: Secondary | ICD-10-CM

## 2017-10-20 ENCOUNTER — Other Ambulatory Visit: Payer: Self-pay | Admitting: Family Medicine

## 2017-10-20 ENCOUNTER — Other Ambulatory Visit: Payer: Self-pay

## 2017-10-20 MED ORDER — INDOMETHACIN 25 MG PO CAPS: ORAL_CAPSULE | ORAL | 0 refills | Status: DC

## 2017-11-09 IMAGING — DX DG ANKLE COMPLETE 3+V*R*
3 series · 3 of 3 positions shown · non-contrast
Comparison: None.

CLINICAL DATA: Right foot and ankle swelling and discomfort for 3
weeks. No known injury.

EXAM:
RIGHT ANKLE - COMPLETE 3+ VIEW

[ankle ap]
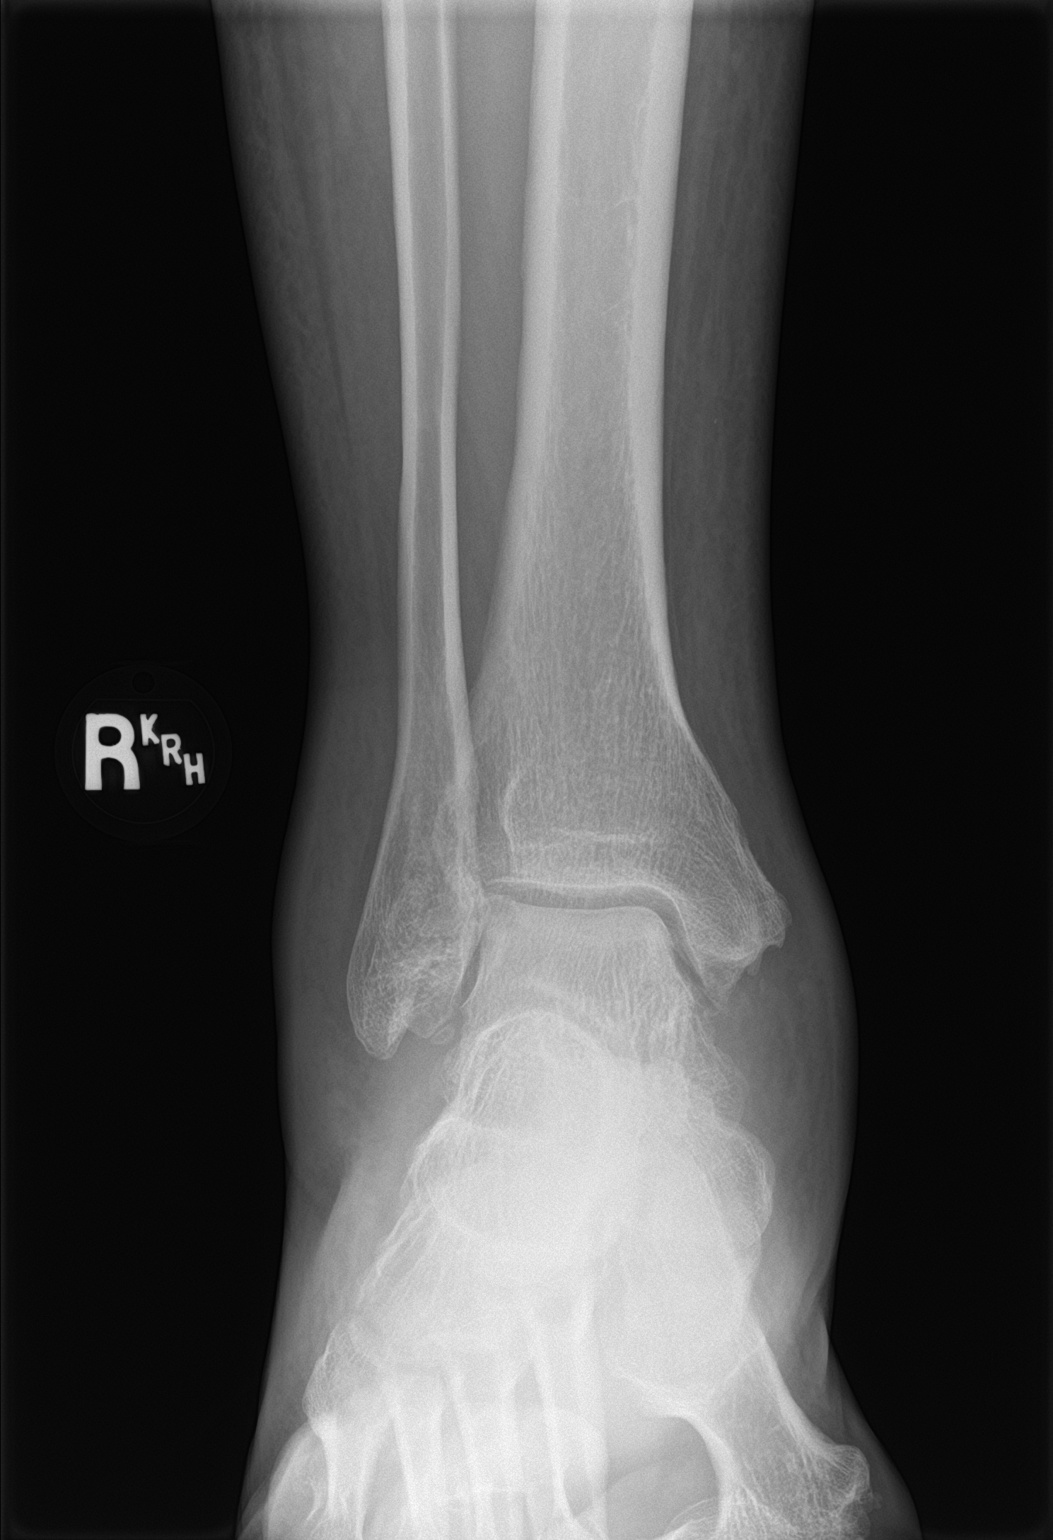

[ankle obl]
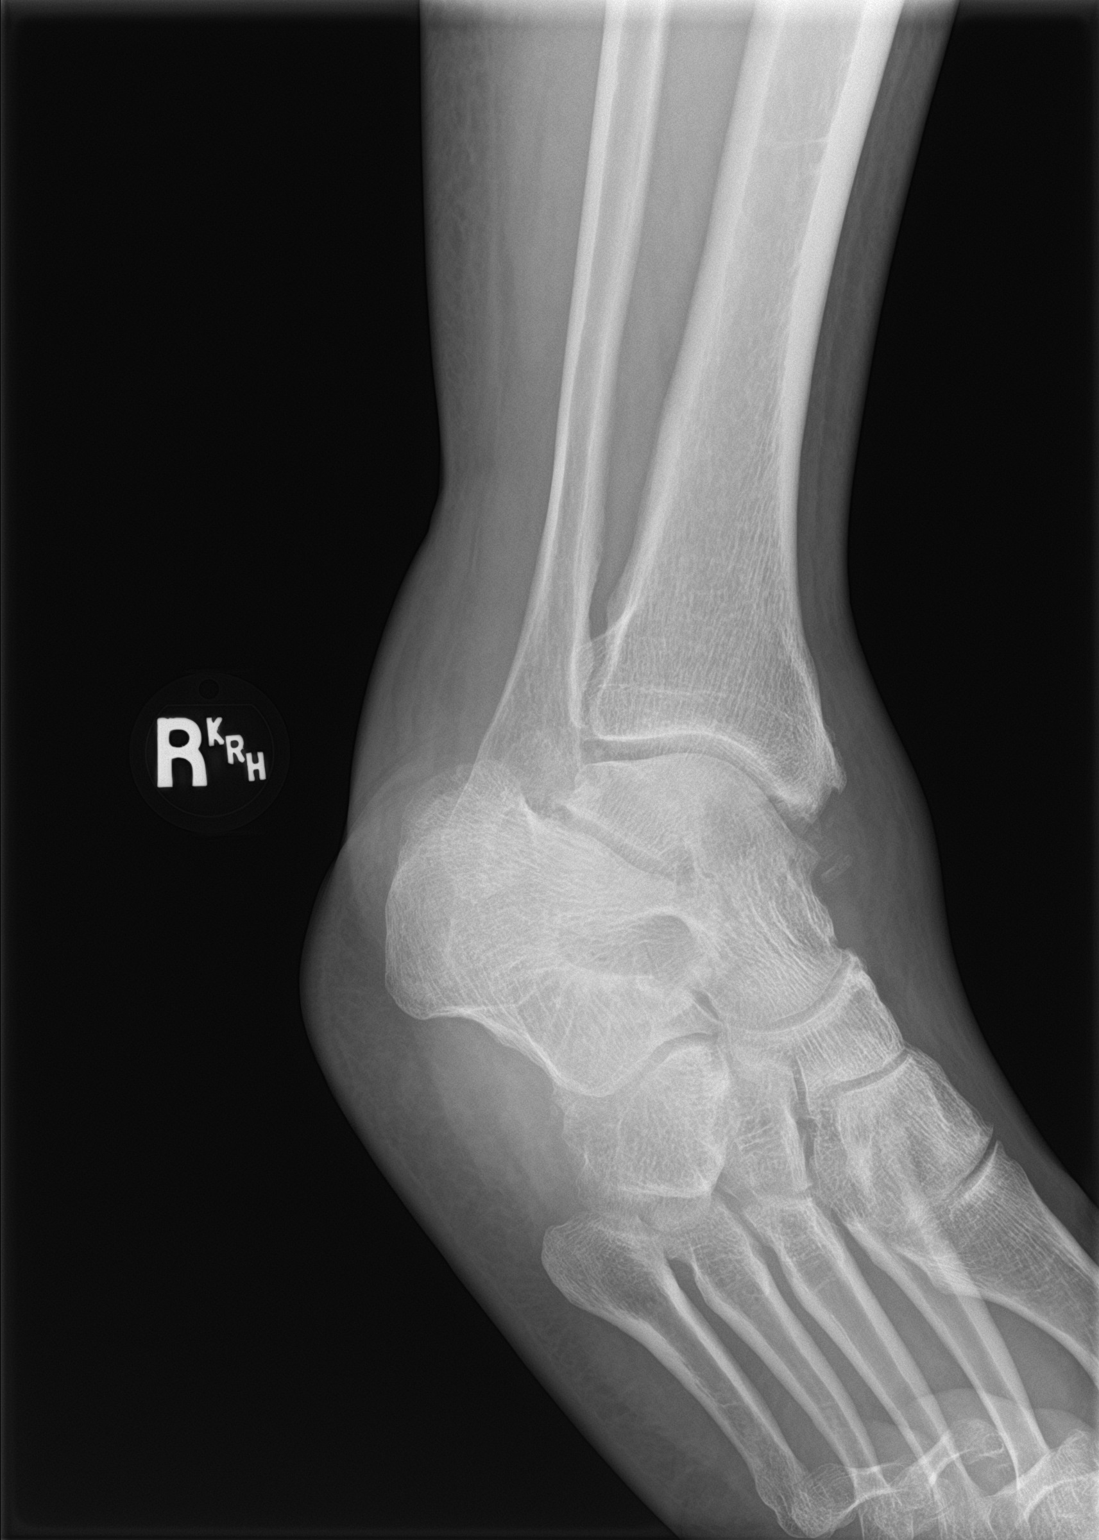

[ankle lat]
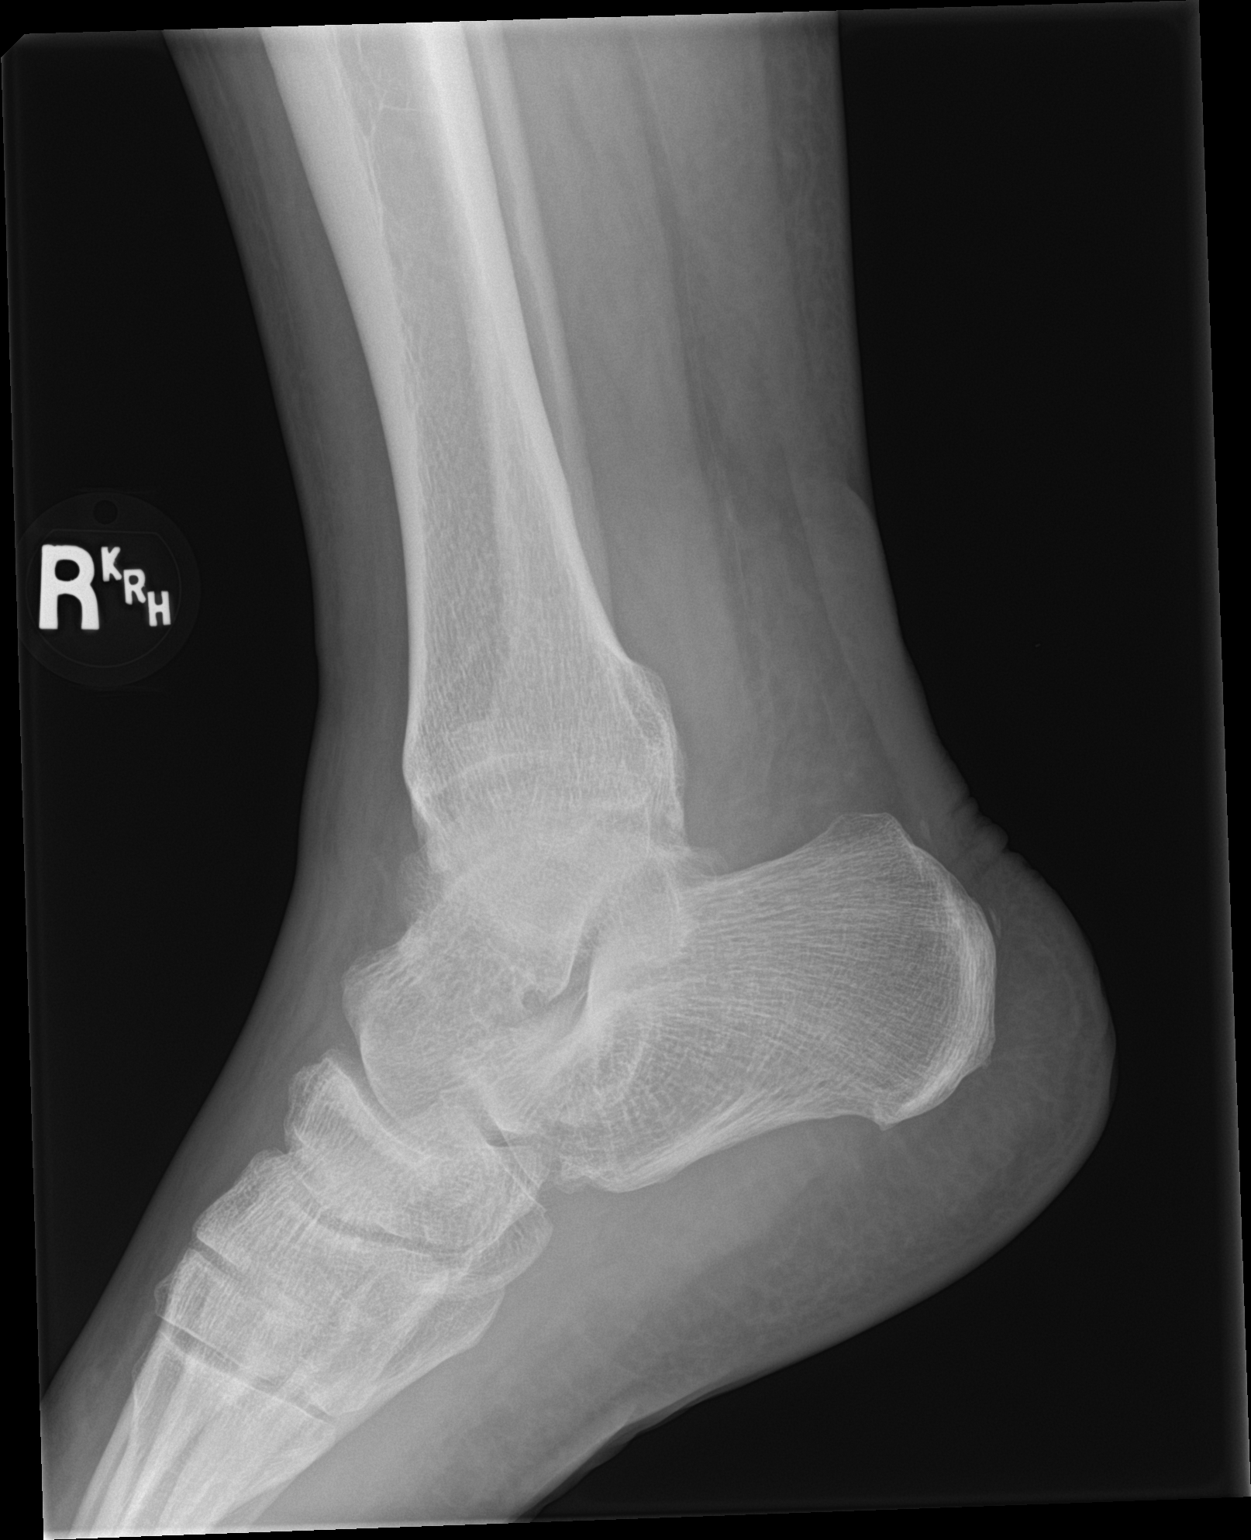

[3 of 3 positions shown; findings below may reference images not displayed]

FINDINGS: Mild diffuse soft tissue swelling. Degenerative changes in the right
ankle with joint space narrowing and spurring. No acute bony
abnormality. Specifically, no fracture, subluxation, or dislocation.
Soft tissues are intact.
IMPRESSION: No acute bony abnormality.

## 2017-11-15 ENCOUNTER — Other Ambulatory Visit: Payer: Self-pay | Admitting: Family Medicine

## 2017-11-15 DIAGNOSIS — I10 Essential (primary) hypertension: Secondary | ICD-10-CM

## 2017-11-15 DIAGNOSIS — R7989 Other specified abnormal findings of blood chemistry: Secondary | ICD-10-CM

## 2017-11-15 DIAGNOSIS — M109 Gout, unspecified: Secondary | ICD-10-CM

## 2017-11-29 LAB — BASIC METABOLIC PANEL
BUN: 18 (ref 4–21)
CREATININE: 1.6 — AB (ref 0.6–1.3)
GLUCOSE: 113
POTASSIUM: 3.6 (ref 3.4–5.3)
SODIUM: 135 — AB (ref 137–147)

## 2017-11-29 LAB — CBC AND DIFFERENTIAL
Hemoglobin: 11.6 — AB (ref 13.5–17.5)
Platelets: 278 (ref 150–399)
WBC: 12.1

## 2017-12-08 ENCOUNTER — Telehealth: Payer: Self-pay | Admitting: Family Medicine

## 2017-12-08 DIAGNOSIS — I10 Essential (primary) hypertension: Secondary | ICD-10-CM

## 2017-12-08 DIAGNOSIS — M109 Gout, unspecified: Secondary | ICD-10-CM

## 2017-12-08 DIAGNOSIS — R7989 Other specified abnormal findings of blood chemistry: Secondary | ICD-10-CM

## 2017-12-08 MED ORDER — INDOMETHACIN 25 MG PO CAPS: ORAL_CAPSULE | ORAL | 0 refills | Status: DC

## 2017-12-08 MED ORDER — SIMVASTATIN 20 MG PO TABS: 20.0000 mg | ORAL_TABLET | Freq: Every day | ORAL | 0 refills | Status: DC

## 2017-12-08 MED ORDER — ATENOLOL 100 MG PO TABS: 100.0000 mg | ORAL_TABLET | Freq: Every day | ORAL | 0 refills | Status: DC

## 2017-12-08 MED ORDER — VERAPAMIL HCL ER 240 MG PO TBCR: 240.0000 mg | EXTENDED_RELEASE_TABLET | Freq: Every day | ORAL | 0 refills | Status: DC

## 2017-12-08 NOTE — Telephone Encounter (Signed)
Refills have been sent to the pharmacy for a 30 day supply. Patient verbally understands that he needs to keep his follow up appointment scheduled for Monday. Lennard Capek,CMA

## 2017-12-08 NOTE — Telephone Encounter (Signed)
Patient called this afternoon wanting to schedule with Dr. Denyse Amassorey. He did state that he has been out of BP meds for about 2weeks. I told him that because he hasn't been seen for some time, that refills probaly could not be called in. He asked that I inquire about it anyway.

## 2017-12-12 ENCOUNTER — Encounter: Payer: Self-pay | Admitting: Family Medicine

## 2017-12-12 ENCOUNTER — Ambulatory Visit (INDEPENDENT_AMBULATORY_CARE_PROVIDER_SITE_OTHER): Payer: Self-pay | Admitting: Family Medicine

## 2017-12-12 VITALS — BP 157/90 | HR 70 | Ht 72.0 in | Wt 301.0 lb

## 2017-12-12 DIAGNOSIS — N183 Chronic kidney disease, stage 3 unspecified: Secondary | ICD-10-CM | POA: Insufficient documentation

## 2017-12-12 DIAGNOSIS — M5136 Other intervertebral disc degeneration, lumbar region: Secondary | ICD-10-CM | POA: Insufficient documentation

## 2017-12-12 DIAGNOSIS — M109 Gout, unspecified: Secondary | ICD-10-CM

## 2017-12-12 DIAGNOSIS — G4730 Sleep apnea, unspecified: Secondary | ICD-10-CM | POA: Insufficient documentation

## 2017-12-12 DIAGNOSIS — I10 Essential (primary) hypertension: Secondary | ICD-10-CM

## 2017-12-12 DIAGNOSIS — M1711 Unilateral primary osteoarthritis, right knee: Secondary | ICD-10-CM | POA: Insufficient documentation

## 2017-12-12 DIAGNOSIS — E0500 Thyrotoxicosis with diffuse goiter without thyrotoxic crisis or storm: Secondary | ICD-10-CM | POA: Insufficient documentation

## 2017-12-12 DIAGNOSIS — M51369 Other intervertebral disc degeneration, lumbar region without mention of lumbar back pain or lower extremity pain: Secondary | ICD-10-CM | POA: Insufficient documentation

## 2017-12-12 DIAGNOSIS — E785 Hyperlipidemia, unspecified: Secondary | ICD-10-CM | POA: Insufficient documentation

## 2017-12-12 MED ORDER — ALLOPURINOL 100 MG PO TABS: 100.0000 mg | ORAL_TABLET | Freq: Every day | ORAL | 6 refills | Status: DC

## 2017-12-12 MED ORDER — PREDNISONE 50 MG PO TABS
ORAL_TABLET | ORAL | 0 refills | Status: DC
Start: 2017-12-12 — End: 2018-03-15

## 2017-12-12 NOTE — Progress Notes (Signed)
Zachary Ibarra is a 52 y.o. male who presents to [facility]: Primary Care Sports Medicine today for HTN, Knee Pain, Gout and CKD.   Knee pain and gout: Zachary Ibarra has been having disabling right knee pain and multiple joint pain and swelling for several months. He has been having trouble working because of the pain. He was seen in the emergency department on February 11 where he was found to have a large right knee effusion and x-ray of his knee showing degenerative changes and possible pseudogout.  He had labs including an aspiration of his right knee that showed uric acid crystals but no calcium pyrophosphate.  He was given a course of oral prednisone which she has not started taking yet. For his knee pain overall has been using indomethacin pretty regularly which does help.  He denies any fevers or chills.  He notes since his emergency room visit his swelling returned and his knee continues to be bothersome.  Hypertension: Zachary Ibarra takes the medications listed below with no chest pains palpitations or shortness of breath.  No lightheadedness or dizziness.  Chronic kidney disease: Zachary Ibarra is known to have elevated creatinine since last year.  He declined further intervention because he did not have health insurance.  His labs were checked in the hospital and his creatinine was around 1.5.  Past Medical History:  Diagnosis Date  . DDD (degenerative disc disease), lumbar   . High cholesterol   . Hypertension   . Sleep apnea   . Thyroid disease    No past surgical history on file. Social History   Tobacco Use  . Smoking status: Never Smoker  . Smokeless tobacco: Never Used  Substance Use Topics  . Alcohol use: Yes   family history is not on file.  ROS as above:  Medications: Current Outpatient Medications  Medication Sig Dispense Refill  . atenolol (TENORMIN) 100 MG tablet Take 1 tablet (100 mg  total) by mouth daily. 30 tablet 0  . benazepril (LOTENSIN) 40 MG tablet Take 1 tablet (40 mg total) by mouth daily. 90 tablet 2  . diclofenac sodium (VOLTAREN) 1 % GEL Apply 2 g topically 4 (four) times daily. To affected joint. 100 g 11  . hydrochlorothiazide (HYDRODIURIL) 25 MG tablet Take 1 tablet (25 mg total) by mouth daily. 90 tablet 2  . simvastatin (ZOCOR) 20 MG tablet Take 1 tablet (20 mg total) by mouth daily. 30 tablet 0  . verapamil (CALAN-SR) 240 MG CR tablet Take 1 tablet (240 mg total) by mouth at bedtime. Patient needs to schedule a follow up appointment with PCP before more refills. 30 tablet 0  . allopurinol (ZYLOPRIM) 100 MG tablet Take 1 tablet (100 mg total) by mouth daily. 30 tablet 6  . predniSONE (DELTASONE) 50 MG tablet Take daily for gout flairs as needed. 30 tablet 0   No current facility-administered medications for this visit.    No Known Allergies  Health Maintenance Health Maintenance  Topic Date Due  . INFLUENZA VACCINE  08/14/2018 (Originally 05/18/2017)  . COLONOSCOPY  12/12/2018 (Originally 07/21/2014)  . TETANUS/TDAP  12/12/2018 (Originally 05/20/2016)  . Hepatitis C Screening  12/12/2018 (Originally [birth year])  . HIV Screening  12/12/2018 (Originally 07/22/1979)     Exam:  BP (!) 157/90   Pulse 70   Ht 6' (1.829 m)   Wt (!) 301 lb (136.5 kg)   BMI 40.82 kg/m  Gen: Well NAD HEENT: EOMI,  MMM Lungs: Normal work of  breathing. CTABL Heart: RRR no MRG Abd: NABS, Soft. Nondistended, Nontender Exts: Brisk capillary refill, warm and well perfused.  Right knee: Large effusion no erythema decreased quadriceps bulk. Diffusely tender. Normal range of motion. Stable ligamentous exam.   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 No fracture, dislocation, or destructive lesion.   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  Electronically Signed by: Art Buff Examination Body Fluid Synovial Fld Joint, Right Knee2/09/2018 [facility] Component Name Value Ref Range  Body Fluid Type Synovial Fld   BF Crystal Quant Few /per HPF  BF Crystal Type Monosodium Urate (MSU)/Uric Acid   Specimen  Synovial Fld - Joint, Right Knee   Care Everywhere Result Report Basic Metabolic [id number] [facility] Component Name Value Ref Range  Na 135 (L) 136 - 146 mmol/L  Potassium 3.6 (L) 3.7 - 5.4 mmol/L  Cl 98 97 - 108 mmol/L  CO2 26 20 - 32 mmol/L  Glucose 113 (H) 65 - 99 mg/dL  BUN 18 6 - 24 mg/dL  Creatinine 1.58 (H) 0.76 - 1.27 mg/dL  Ca 9.6 8.7 - 10.2 mg/dL  BUN/CREAT RATIO 11.4 11 - 26   GFR AFRICAN AMERICAN 57  Comment:  African-American:  Normal GFR (glomerular filtration rate) > 60 mL/min/1.73 meters squared. < 60 may include impaired kidney function based on creatinine, age, gender, and race normalized to accepted average body surface area mL/min/1.29m  GFR Non African American 49  Comment:  Non African American:  Normal GFR (glomerular filtration rate) > 60 mL/min/1.73 meters squared. < 60 may include impaired kidney function based on creatinine, age, gender, and race normalized to accepted average body surface area. mL/min/1.764m AGAP 11 7 - 16 mmol/L  Specimen  Blood   Uric Acid: 9.3   CRP 149.9  ESR: 68  WBC 12.1 (H) 5.1 - 10.8 thou/mcL  RBC 4.09 4.05 - 5.64 million/mcL  HGB 11.6 (L) 13.5 - 17.5 gm/dL  HCT 38.4 (L) 40.5 - 52.5 %  MCV 94 83 - 97 fL  MCH 28.4 28.0 - 33.0 pg  MCHC 30.2 (L) 32.0 - 36.0 gm/dL  Plt Ct 278 150 - 400 thou/mcL   Procedure: Real-time Ultrasound Guided Injection of right knee  Device: GE Logiq E   Images permanently stored and available for review in the ultrasound unit. Verbal informed consent obtained.  Discussed risks and benefits of procedure. Warned about infection bleeding damage  to structures skin hypopigmentation and fat atrophy among others. Patient expresses understanding and agreement Time-out conducted.   Noted no overlying erythema, induration, or other signs of local infection.   Skin prepped in a sterile fashion.   Local anesthesia: Topical Ethyl chloride.   With sterile technique and under real time ultrasound guidance:  [id number] and [id number] injected easily.   Completed without difficulty   Pain immediately resolved suggesting accurate placement of the medication.   Advised to call if fevers/chills, erythema, induration, drainage, or persistent bleeding.   Images permanently stored and available for review in the ultrasound unit.  Impression: Technically successful ultrasound guided injection.       No results found for this or any previous visit (from the past 72 hour(s)). No results found.    Assessment and Plan: 51 y89. 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  --- GOUT:  Chronic and not well controlled.  Indomethacin not safe long-term due to chronic kidney disease.  Plan to start allopurinol and use prednisone as needed for gout flares.  Recheck in 1 month.  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  Ultimately will require knee replacement.  Steroid injection as noted above today for temporary pain control.  Recommended quadricep strengthening exercises.  Discussed exercise bike.  Chronic kidney disease: Creatinine actually improved over the last several months.  Work on blood pressure control avoid excessive NSAIDs and recheck in 1 month.  Ultimately I would love nephrology help but patient does not have health insurance and that is a huge barrier right now.  Hypertension: Not well controlled.  Patient not quite on maximal therapy at this time.  Next step will be adding spironolactone which anticipate doing next month.  Psychosocial: Lack of health insurance.  This is a big barrier.  Applying for [facility] health charity program.  If  patient continues to be unable to work consider applying for social security disability.   No orders of the defined types were placed in this encounter.  Meds ordered this encounter  Medications  . allopurinol (ZYLOPRIM) 100 MG tablet    Sig: Take 1 tablet (100 mg total) by mouth daily.    Dispense:  30 tablet    Refill:  6  . predniSONE (DELTASONE) 50 MG tablet    Sig: Take daily for gout flairs as needed.    Dispense:  30 tablet    Refill:  0     Discussed warning signs or symptoms. Please see discharge instructions. Patient expresses understanding.

## 2017-12-12 NOTE — Patient Instructions (Signed)
Thank you for coming in today. Start allopurinol daily for gout.  Try to avoid indomethacin.  Use prednisone for gout flairs as needed.   Call or go to the ER if you develop a large red swollen joint with extreme pain or oozing puss.    Continue blood pressure medicine.   Recheck in 1 month.   Return sooner if needed.    Allopurinol tablets What is this medicine? ALLOPURINOL (al oh PURE i nole) reduces the amount of uric acid the body makes. It is used to treat the symptoms of gout. It is also used to treat or prevent high uric acid levels that occur as a result of certain types of chemotherapy. This medicine may also help patients who frequently have kidney stones. This medicine may be used for other purposes; ask your health care provider or pharmacist if you have questions. COMMON BRAND NAME(S): Zyloprim What should I tell my health care provider before I take this medicine? They need to know if you have any of these conditions: -kidney or liver disease -an unusual or allergic reaction to allopurinol, other medicines, foods, dyes, or preservatives -pregnant or trying to get pregnant -breast feeding How should I use this medicine? Take this medicine by mouth with a glass of water. Follow the directions on the prescription label. If this medicine upsets your stomach, take it with food or milk. Take your doses at regular intervals. Do not take your medicine more often than directed. Talk to your pediatrician regarding the use of this medicine in children. Special care may be needed. While this drug may be prescribed for children as young as 6 years for selected conditions, precautions do apply. Overdosage: If you think you have taken too much of this medicine contact a poison control center or emergency room at once. NOTE: This medicine is only for you. Do not share this medicine with others. What if I miss a dose? If you miss a dose, take it as soon as you can. If it is almost time  for your next dose, take only that dose. Do not take double or extra doses. What may interact with this medicine? Do not take this medicine with the following medication: -didanosine, ddI This medicine may also interact with the following medications: -amoxicillin or ampicillin -azathioprine -certain medicines used to treat gout -certain types of diuretics -chlorpropamide -cyclosporine -dicumarol -mercaptopurine -tolbutamide -warfarin This list may not describe all possible interactions. Give your health care provider a list of all the medicines, herbs, non-prescription drugs, or dietary supplements you use. Also tell them if you smoke, drink alcohol, or use illegal drugs. Some items may interact with your medicine. What should I watch for while using this medicine? Visit your doctor or health care professional for regular checks on your progress. If you are taking this medicine to treat gout, you may not have less frequent attacks at first. Keep taking your medicine regularly and the attacks should get better within 2 to 6 weeks. Drink plenty of water (10 to 12 full glasses a day) while you are taking this medicine. This will help to reduce stomach upset and reduce the risk of getting gout or kidney stones. Call your doctor or health care professional at once if you get a skin rash together with chills, fever, sore throat, or nausea and vomiting, if you have blood in your urine, or difficulty passing urine. Do not take vitamin C without asking your doctor or health care professional. Too much vitamin C can  increase the chance of getting kidney stones. You may get drowsy or dizzy. Do not drive, use machinery, or do anything that needs mental alertness until you know how this drug affects you. Do not stand or sit up quickly, especially if you are an older patient. This reduces the risk of dizzy or fainting spells. Alcohol can make you more drowsy and dizzy. Alcohol can also increase the chance of  stomach problems and increase the amount of uric acid in your blood. Avoid alcoholic drinks. What side effects may I notice from receiving this medicine? Side effects that you should report to your doctor or health care professional as soon as possible: -allergic reactions like skin rash, itching or hives, swelling of the face, lips, or tongue -breathing problems -muscle aches or pains -redness, blistering, peeling or loosening of the skin, including inside the mouth Side effects that usually do not require medical attention (report to your doctor or health care professional if they continue or are bothersome): -changes in taste -diarrhea -indigestion -stomach pain or cramps This list may not describe all possible side effects. Call your doctor for medical advice about side effects. You may report side effects to FDA at 1-800-FDA-1088. Where should I keep my medicine? Keep out of the reach of children. Store at room temperature between 15 and 25 degrees C (59 and 77 degrees F). Protect from light and moisture. Throw away any unused medicine after the expiration date. NOTE: This sheet is a summary. It may not cover all possible information. If you have questions about this medicine, talk to your doctor, pharmacist, or health care provider.  2018 Elsevier/Gold Standard (2008-04-08 14:26:54)

## 2017-12-22 ENCOUNTER — Other Ambulatory Visit: Payer: Self-pay | Admitting: Family Medicine

## 2017-12-26 ENCOUNTER — Encounter: Payer: Self-pay | Admitting: General Practice

## 2017-12-26 LAB — URIC ACID
CRP Result: 149.9
Erythrocyte Sed Rate: 68
Uric Acid: 9.3

## 2018-01-09 ENCOUNTER — Ambulatory Visit: Payer: Self-pay | Admitting: Family Medicine

## 2018-01-13 ENCOUNTER — Ambulatory Visit: Payer: Self-pay | Admitting: Family Medicine

## 2018-01-17 ENCOUNTER — Ambulatory Visit (INDEPENDENT_AMBULATORY_CARE_PROVIDER_SITE_OTHER): Payer: Self-pay | Admitting: Family Medicine

## 2018-01-17 ENCOUNTER — Encounter: Payer: Self-pay | Admitting: Family Medicine

## 2018-01-17 VITALS — BP 130/80 | HR 73 | Ht 72.0 in | Wt 304.0 lb

## 2018-01-17 DIAGNOSIS — N183 Chronic kidney disease, stage 3 unspecified: Secondary | ICD-10-CM

## 2018-01-17 DIAGNOSIS — M109 Gout, unspecified: Secondary | ICD-10-CM

## 2018-01-17 DIAGNOSIS — I10 Essential (primary) hypertension: Secondary | ICD-10-CM

## 2018-01-17 NOTE — Progress Notes (Signed)
Zachary Ibarra is a 52 y.o. male who [facility] today for follow up.   Gout: Patient started allopurinol and prednisone as needed at the last visit 1 month ago. Since then he denies any gout flares or fever.    Knee pain: Patient received an ultrasound guided right knee injection last month, which he states cleared up his knee pain significantly. He has not had pain since and has been able to use an exercise bike.   Blood pressure: The patient reports he has been taking his medications as prescribed with no side effects. He denies any significant lifestyle modifications since the last visit. He does not report any new symptoms.   CKD: Zachary Ibarra is known to have elevated creatinine. Last visit his creatinine was improved and he had been working on avoiding excess NSAIDs. He is not reporting any symptoms of worsening CKD.   Past Medical History:  Diagnosis Date  . DDD (degenerative disc disease), lumbar   . High cholesterol   . Hypertension   . Sleep apnea   . Thyroid disease    No past surgical history on file. Social History   Tobacco Use  . Smoking status: Never Smoker  . Smokeless tobacco: Never Used  Substance Use Topics  . Alcohol use: Yes   family history is not on file.  ROS as above:  Medications: Current Outpatient Medications  Medication Sig Dispense Refill  . allopurinol (ZYLOPRIM) 100 MG tablet Take 1 tablet (100 mg total) by mouth daily. 30 tablet 6  . atenolol (TENORMIN) 100 MG tablet Take 1 tablet (100 mg total) by mouth daily. 30 tablet 0  . benazepril (LOTENSIN) 40 MG tablet Take 1 tablet (40 mg total) by mouth daily. 90 tablet 2  . diclofenac sodium (VOLTAREN) 1 % GEL Apply 2 g topically 4 (four) times daily. To affected joint. 100 g 11  . hydrochlorothiazide (HYDRODIURIL) 25 MG tablet Take 1 tablet (25 mg total) by mouth  daily. 90 tablet 2  . predniSONE (DELTASONE) 50 MG tablet Take daily for gout flairs as needed. 30 tablet 0  . simvastatin (ZOCOR) 20 MG tablet Take 1 tablet (20 mg total) by mouth daily. 30 tablet 0  . spironolactone (ALDACTONE) 25 MG tablet TAKE 1 TABLET BY MOUTH ONCE DAILY (NEED  APPOINTMENT  FOR  ADDITIONAL  REFILLS) 30 tablet 0  . verapamil (CALAN-SR) 240 MG CR tablet Take 1 tablet (240 mg total) by mouth at bedtime. Patient needs to schedule a follow up appointment with PCP before more refills. 30 tablet 0   No current facility-administered medications for this visit.    No Known Allergies  Health Maintenance Health Maintenance  Topic Date Due  . INFLUENZA VACCINE  08/14/2018 (Originally 05/18/2018)  . COLONOSCOPY  12/12/2018 (Originally 07/21/2014)  . TETANUS/TDAP  12/12/2018 (Originally 05/20/2016)  . Hepatitis C Screening  12/12/2018 (Originally 06/12/1966)  . HIV Screening  12/12/2018 (Originally 07/22/1979)     Exam:  BP 130/80   Pulse 73   Ht 6' (1.829 m)   Wt (!) 304 lb (137.9 kg)   BMI 41.23 kg/m  Gen: Well NAD HEENT: EOMI,  MMM Lungs: Normal work of breathing. CTABL Heart: RRR no MRG Abd: NABS, Soft. Nondistended, Nontender Exts: Brisk capillary refill, warm and well perfused.    No results found for this or any previous visit (from the past 72 hour(s)). No results found.    Assessment and Plan:  52 y.o. male with   GOUT: Patient has not had a flare since starting Allopurinol. Patient has no complaints. Patient should use prednisone as needed, but the allopurinol seems to be controlling his previously uncontrolled chronic gout.   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 Patient has been able to exercise as discussed, and is not complaining of any worsening pain at this time. Ultimately will require knee replacement, but should return for further steroid injections when the knee pain returns.    Hypertension: Patient's in-office blood pressure was 130/80 today, which is controlled. The addition of spironolactone seems to have been the last piece of the puzzle to his HTN. He is reporting no adverse side effects. We will check his urine today to check for electrolyte abnormalities like hyperkalemia from the spironolactone. Patient will  CKD: We will check his creatinine again today. Patient's creatinine were improved at last visit. Should continue monitoring for worsening GFR. Ultimately I would love nephrology help, but patient does not have health insurance and that is a huge barrier.    Orders Placed This Encounter  Procedures  . BASIC METABOLIC PANEL WITH GFR  . Uric acid   No orders of the defined types were placed in this encounter.    Discussed warning signs or symptoms. Please see discharge instructions. Patient expresses understanding.

## 2018-01-17 NOTE — Patient Instructions (Signed)
Thank you for coming in today. Get labs now.  If all is well recheck in 1 year.  Let me know if you are running out of medicine.

## 2018-02-03 ENCOUNTER — Other Ambulatory Visit: Payer: Self-pay | Admitting: Family Medicine

## 2018-02-03 DIAGNOSIS — M109 Gout, unspecified: Secondary | ICD-10-CM

## 2018-02-03 DIAGNOSIS — R7989 Other specified abnormal findings of blood chemistry: Secondary | ICD-10-CM

## 2018-02-03 DIAGNOSIS — I10 Essential (primary) hypertension: Secondary | ICD-10-CM

## 2018-02-07 ENCOUNTER — Other Ambulatory Visit: Payer: Self-pay

## 2018-02-07 DIAGNOSIS — R7989 Other specified abnormal findings of blood chemistry: Secondary | ICD-10-CM

## 2018-02-07 DIAGNOSIS — I10 Essential (primary) hypertension: Secondary | ICD-10-CM

## 2018-02-07 DIAGNOSIS — M109 Gout, unspecified: Secondary | ICD-10-CM

## 2018-02-07 MED ORDER — VERAPAMIL HCL ER 240 MG PO TBCR: 240.0000 mg | EXTENDED_RELEASE_TABLET | Freq: Every day | ORAL | 0 refills | Status: DC

## 2018-02-09 ENCOUNTER — Other Ambulatory Visit: Payer: Self-pay

## 2018-02-09 DIAGNOSIS — M109 Gout, unspecified: Secondary | ICD-10-CM

## 2018-02-09 DIAGNOSIS — R7989 Other specified abnormal findings of blood chemistry: Secondary | ICD-10-CM

## 2018-02-09 DIAGNOSIS — I10 Essential (primary) hypertension: Secondary | ICD-10-CM

## 2018-02-09 MED ORDER — ATENOLOL 100 MG PO TABS
100.0000 mg | ORAL_TABLET | Freq: Every day | ORAL | 0 refills | Status: DC
Start: 2018-02-09 — End: 2019-10-16

## 2018-03-15 ENCOUNTER — Other Ambulatory Visit: Payer: Self-pay | Admitting: Family Medicine

## 2018-04-14 ENCOUNTER — Other Ambulatory Visit: Payer: Self-pay | Admitting: Family Medicine

## 2018-04-14 DIAGNOSIS — M109 Gout, unspecified: Secondary | ICD-10-CM

## 2018-04-14 DIAGNOSIS — R7989 Other specified abnormal findings of blood chemistry: Secondary | ICD-10-CM

## 2018-04-14 DIAGNOSIS — I10 Essential (primary) hypertension: Secondary | ICD-10-CM

## 2018-05-05 ENCOUNTER — Other Ambulatory Visit: Payer: Self-pay | Admitting: Family Medicine

## 2018-05-05 DIAGNOSIS — M109 Gout, unspecified: Secondary | ICD-10-CM

## 2018-05-05 DIAGNOSIS — R7989 Other specified abnormal findings of blood chemistry: Secondary | ICD-10-CM

## 2018-05-05 DIAGNOSIS — I10 Essential (primary) hypertension: Secondary | ICD-10-CM

## 2018-06-02 ENCOUNTER — Other Ambulatory Visit: Payer: Self-pay | Admitting: Family Medicine

## 2018-06-23 ENCOUNTER — Other Ambulatory Visit: Payer: Self-pay | Admitting: Family Medicine

## 2018-06-23 DIAGNOSIS — R7989 Other specified abnormal findings of blood chemistry: Secondary | ICD-10-CM

## 2018-06-23 DIAGNOSIS — M109 Gout, unspecified: Secondary | ICD-10-CM

## 2018-06-23 DIAGNOSIS — I10 Essential (primary) hypertension: Secondary | ICD-10-CM

## 2018-07-24 ENCOUNTER — Other Ambulatory Visit: Payer: Self-pay | Admitting: Family Medicine

## 2018-09-01 ENCOUNTER — Other Ambulatory Visit: Payer: Self-pay | Admitting: Family Medicine

## 2018-09-01 DIAGNOSIS — I10 Essential (primary) hypertension: Secondary | ICD-10-CM

## 2018-09-01 DIAGNOSIS — M109 Gout, unspecified: Secondary | ICD-10-CM

## 2018-09-01 DIAGNOSIS — R7989 Other specified abnormal findings of blood chemistry: Secondary | ICD-10-CM

## 2018-09-25 ENCOUNTER — Other Ambulatory Visit: Payer: Self-pay | Admitting: Family Medicine

## 2018-11-03 ENCOUNTER — Other Ambulatory Visit: Payer: Self-pay | Admitting: Family Medicine

## 2018-11-03 DIAGNOSIS — R7989 Other specified abnormal findings of blood chemistry: Secondary | ICD-10-CM

## 2018-11-03 DIAGNOSIS — I10 Essential (primary) hypertension: Secondary | ICD-10-CM

## 2018-11-03 DIAGNOSIS — M109 Gout, unspecified: Secondary | ICD-10-CM

## 2018-12-29 ENCOUNTER — Other Ambulatory Visit: Payer: Self-pay | Admitting: Family Medicine

## 2019-01-02 ENCOUNTER — Other Ambulatory Visit: Payer: Self-pay | Admitting: Family Medicine

## 2019-01-03 ENCOUNTER — Other Ambulatory Visit: Payer: Self-pay | Admitting: Family Medicine

## 2019-01-04 NOTE — Telephone Encounter (Signed)
Patient will need follow-up appointment with me ASAP.  Schedule now.  Very limited prednisone refill.

## 2019-01-04 NOTE — Telephone Encounter (Signed)
Called pt and message was transcribed through system. KG LPN

## 2019-01-16 ENCOUNTER — Telehealth: Payer: Self-pay | Admitting: General Practice

## 2019-01-16 NOTE — Telephone Encounter (Signed)
Left message with information below and for patient to call us back to scheduele the webex.  °

## 2019-01-16 NOTE — Telephone Encounter (Signed)
-----   Message from Rodolph Bong, MD sent at 01/16/2019  9:02 AM EDT ----- Regarding: WebEx Please schedule upcoming visit for WebEx

## 2019-01-18 ENCOUNTER — Other Ambulatory Visit: Payer: Self-pay

## 2019-01-18 ENCOUNTER — Ambulatory Visit (INDEPENDENT_AMBULATORY_CARE_PROVIDER_SITE_OTHER): Payer: Self-pay | Admitting: Family Medicine

## 2019-01-18 ENCOUNTER — Encounter: Payer: Self-pay | Admitting: Family Medicine

## 2019-01-18 VITALS — BP 140/80

## 2019-01-18 DIAGNOSIS — Z0000 Encounter for general adult medical examination without abnormal findings: Secondary | ICD-10-CM

## 2019-01-18 DIAGNOSIS — E669 Obesity, unspecified: Secondary | ICD-10-CM

## 2019-01-18 DIAGNOSIS — N183 Chronic kidney disease, stage 3 unspecified: Secondary | ICD-10-CM

## 2019-01-18 DIAGNOSIS — N189 Chronic kidney disease, unspecified: Secondary | ICD-10-CM

## 2019-01-18 DIAGNOSIS — E782 Mixed hyperlipidemia: Secondary | ICD-10-CM

## 2019-01-18 DIAGNOSIS — I10 Essential (primary) hypertension: Secondary | ICD-10-CM

## 2019-01-18 DIAGNOSIS — I129 Hypertensive chronic kidney disease with stage 1 through stage 4 chronic kidney disease, or unspecified chronic kidney disease: Secondary | ICD-10-CM

## 2019-01-18 DIAGNOSIS — E785 Hyperlipidemia, unspecified: Secondary | ICD-10-CM

## 2019-01-18 DIAGNOSIS — M109 Gout, unspecified: Secondary | ICD-10-CM

## 2019-01-18 HISTORY — DX: Morbid (severe) obesity due to excess calories: E66.01

## 2019-01-18 MED ORDER — PREDNISONE 50 MG PO TABS: ORAL_TABLET | ORAL | 0 refills | Status: DC

## 2019-01-18 NOTE — Patient Instructions (Signed)
Thank you for coming in today.  Please get labs as we discussed. I will send in the medications when labs are back.  To need to work on exercise and weight loss.  Recheck every 6 to 12 months depending on lab results.  If kidney function worsens will probably want to see each other sooner or get labs sooner.  Try to contact your life insurance company and get the results of the labs from the insurance physical.  These will be very helpful for me.  You can either mail the results or even better sign up for my chart and upload the results via my chart.  You can send me a message with attached documents.  Take care and keep yourself safe.  I am here for you if needed.

## 2019-01-18 NOTE — Progress Notes (Addendum)
Virtual Visit  via Video Note  I connected with      Zachary Ibarra  by a video enabled telemedicine application and verified that I am speaking with the correct person using two identifiers.   I discussed the limitations of evaluation and management by telemedicine and the availability of in person appointments. The patient expressed understanding and agreed to proceed.  History of Present Illness: Zachary Ibarra is a 53 y.o. male who would like to discuss well adult visit and management of hypertension and CKD and gout.  Zachary Ibarra takes medications listed below.  He notes his blood pressures are typically in the 120s or 130s.  He denies chest pain palpitations or shortness of breath.  He denies any lightheadedness weakness fatigue nausea vomiting or diarrhea.  He notes that he had an insurance physical for life insurance recently and had proteinuria.  He exercises daily by doing brisk walking.  He tries to eat a careful diet.  He does not drink alcohol.   Observations/Objective: Vitals:   01/18/19 0835  BP: 140/80    Wt Readings from Last 5 Encounters:  01/17/18 (!) 304 lb (137.9 kg)  12/12/17 (!) 301 lb (136.5 kg)  02/04/17 (!) 316 lb (143.3 kg)  10/29/16 (!) 308 lb (139.7 kg)  07/15/16 298 lb (135.2 kg)   Exam: Appearance toxic no acute distress well-nourished. Normal Speech.  Shortness of breath tachypnea hoarseness stridor or wheeze   Depression screen [facility] 2/9 01/18/2019  Decreased Interest 0  Down, Depressed, Hopeless 0  PHQ - 2 Score 0  Altered sleeping 1  Tired, decreased energy 0  Change in appetite 1  Feeling bad or failure about yourself  0  Trouble concentrating 0  Moving slowly or fidgety/restless 0  Suicidal thoughts 0  PHQ-9 Score 2  Difficult doing work/chores Not difficult at all     Lab and Radiology Results No results found for this or any previous visit (from the past 72 hour(s)). No results found.   Assessment and Plan: 53 y.o. male with   Well adult.  Doing reasonably well.  Continue exercise and work on [employer].  Hypertension with CKD: Doing reasonably well.  Plan to continue current regimen and check basic metabolic panel.  We will try to get results from insurance physical as well.  Hyperlipidemia: Doing well continue current regimen.  Gout: Doing reasonably well as well.  Check uric acid and adjust allopurinol as needed.  Obesity: Work on weight loss.  Discussed diet strategies and exercise strategies.   PDMP reviewed during this encounter. No orders of the defined types were placed in this encounter.  Meds ordered this encounter  Medications  . predniSONE (DELTASONE) 50 MG tablet    Sig: Take prednsione for gout flairs as needed daily    Dispense:  15 tablet    Refill:  0    Follow Up Instructions:    I discussed the assessment and treatment plan with the patient. The patient was provided an opportunity to ask questions and all were answered. The patient agreed with the plan and demonstrated an understanding of the instructions.   The patient was advised to call back or seek an in-person evaluation if the symptoms worsen or if the condition fails to improve as anticipated.  I provided 20 minutes of non-face-to-face time during this encounter.    Historical information moved to improve visibility of documentation.  Past Medical History:  Diagnosis Date  . DDD (degenerative disc disease), lumbar   .  High cholesterol   . Hypertension   . Sleep apnea   . Thyroid disease    No past surgical history on file. Social History   Tobacco Use  . Smoking status: Never Smoker  . Smokeless tobacco: Never Used  Substance Use Topics  . Alcohol use: Yes   family history is not on file.  Medications: Current Outpatient Medications  Medication Sig Dispense Refill  . allopurinol (ZYLOPRIM) 100 MG tablet Take 1 tablet by mouth once daily 30 tablet 0  . benazepril (LOTENSIN) 40 MG tablet TAKE 1  TABLET BY MOUTH ONCE DAILY 30 tablet 8  . hydrochlorothiazide (HYDRODIURIL) 25 MG tablet TAKE 1 TABLET BY MOUTH ONCE DAILY 30 tablet 8  . predniSONE (DELTASONE) 50 MG tablet Take prednsione for gout flairs as needed daily 15 tablet 0  . simvastatin (ZOCOR) 20 MG tablet TAKE 1 TABLET BY MOUTH ONCE DAILY 30 tablet 8  . spironolactone (ALDACTONE) 25 MG tablet TAKE 1 TABLET BY MOUTH ONCE DAILY. (NEED APPOINTMENT FOR ADDITIONAL REFILLS) 30 tablet 3  . verapamil (CALAN-SR) 240 MG CR tablet TAKE 1 TABLET BY MOUTH ONCE DAILY AT BEDTIME (NEED  LABWORK  DONE) 30 tablet 3  . atenolol (TENORMIN) 100 MG tablet Take 1 tablet (100 mg total) by mouth daily. PT MUST HAVE LABS FOR FURTHER REFILLS (Patient not taking: Reported on 01/18/2019) 30 tablet 0   No current facility-administered medications for this visit.    No Known Allergies     Addendum to correct incorrect date due to templating error

## 2019-01-22 ENCOUNTER — Other Ambulatory Visit: Payer: Self-pay | Admitting: Family Medicine

## 2019-01-23 LAB — BASIC METABOLIC PANEL WITH GFR
BUN/Creatinine Ratio: 15 (calc) (ref 6–22)
BUN: 24 mg/dL (ref 7–25)
CO2: 27 mmol/L (ref 20–32)
Calcium: 10.4 mg/dL — ABNORMAL HIGH (ref 8.6–10.3)
Chloride: 103 mmol/L (ref 98–110)
Creat: 1.6 mg/dL — ABNORMAL HIGH (ref 0.70–1.33)
GFR, Est African American: 56 mL/min/{1.73_m2} — ABNORMAL LOW (ref >=60)
GFR, Est Non African American: 48 mL/min/{1.73_m2} — ABNORMAL LOW (ref >=60)
Glucose, Bld: 136 mg/dL (ref 65–139)
Potassium: 4.4 mmol/L (ref 3.5–5.3)
Sodium: 139 mmol/L (ref 135–146)

## 2019-01-23 LAB — URIC ACID: Uric Acid, Serum: 8.5 mg/dL — ABNORMAL HIGH (ref 4.0–8.0)

## 2019-01-23 MED ORDER — ALLOPURINOL 300 MG PO TABS: 300.0000 mg | ORAL_TABLET | Freq: Every day | ORAL | 1 refills | Status: DC

## 2019-01-23 NOTE — Addendum Note (Signed)
Addended by: Rodolph Bong on: 01/23/2019 07:14 AM   Modules accepted: Orders

## 2019-02-26 ENCOUNTER — Other Ambulatory Visit: Payer: Self-pay | Admitting: Family Medicine

## 2019-02-27 ENCOUNTER — Telehealth: Payer: Self-pay | Admitting: Family Medicine

## 2019-02-27 MED ORDER — PREDNISONE 50 MG PO TABS: ORAL_TABLET | ORAL | 0 refills | Status: DC

## 2019-02-27 NOTE — Telephone Encounter (Signed)
Received fax refill request for prednisone.  Sent prescription to [facility] pharmacy.

## 2019-03-23 ENCOUNTER — Other Ambulatory Visit: Payer: Self-pay | Admitting: Family Medicine

## 2019-05-07 ENCOUNTER — Other Ambulatory Visit: Payer: Self-pay | Admitting: Family Medicine

## 2019-05-07 DIAGNOSIS — I10 Essential (primary) hypertension: Secondary | ICD-10-CM

## 2019-05-07 DIAGNOSIS — M109 Gout, unspecified: Secondary | ICD-10-CM

## 2019-05-07 DIAGNOSIS — R7989 Other specified abnormal findings of blood chemistry: Secondary | ICD-10-CM

## 2019-07-05 ENCOUNTER — Other Ambulatory Visit: Payer: Self-pay | Admitting: Family Medicine

## 2019-07-05 DIAGNOSIS — M109 Gout, unspecified: Secondary | ICD-10-CM

## 2019-07-05 DIAGNOSIS — R7989 Other specified abnormal findings of blood chemistry: Secondary | ICD-10-CM

## 2019-07-05 DIAGNOSIS — I10 Essential (primary) hypertension: Secondary | ICD-10-CM

## 2019-08-10 ENCOUNTER — Other Ambulatory Visit: Payer: Self-pay | Admitting: Family Medicine

## 2019-08-10 DIAGNOSIS — I10 Essential (primary) hypertension: Secondary | ICD-10-CM

## 2019-08-10 DIAGNOSIS — R7989 Other specified abnormal findings of blood chemistry: Secondary | ICD-10-CM

## 2019-08-10 DIAGNOSIS — M109 Gout, unspecified: Secondary | ICD-10-CM

## 2019-10-10 ENCOUNTER — Telehealth: Payer: Self-pay | Admitting: Osteopathic Medicine

## 2019-10-10 ENCOUNTER — Other Ambulatory Visit: Payer: Self-pay | Admitting: Family Medicine

## 2019-10-10 DIAGNOSIS — I10 Essential (primary) hypertension: Secondary | ICD-10-CM

## 2019-10-10 DIAGNOSIS — M109 Gout, unspecified: Secondary | ICD-10-CM

## 2019-10-10 DIAGNOSIS — R7989 Other specified abnormal findings of blood chemistry: Secondary | ICD-10-CM

## 2019-10-10 NOTE — Telephone Encounter (Signed)
Patient was a former Dr. Georgina Snell patient and is in need of an establish care appointment. Can you call him and make a follow up with one of the providers so he can get more of his blood pressure medications.

## 2019-10-10 NOTE — Telephone Encounter (Signed)
Left patient a voicemail with information below. Let patient know to call us back to schedule an appointment. 

## 2019-10-15 ENCOUNTER — Other Ambulatory Visit: Payer: Self-pay | Admitting: Neurology

## 2019-10-15 DIAGNOSIS — M109 Gout, unspecified: Secondary | ICD-10-CM

## 2019-10-15 DIAGNOSIS — I10 Essential (primary) hypertension: Secondary | ICD-10-CM

## 2019-10-15 DIAGNOSIS — R7989 Other specified abnormal findings of blood chemistry: Secondary | ICD-10-CM

## 2019-10-15 MED ORDER — SPIRONOLACTONE 25 MG PO TABS: 25.0000 mg | ORAL_TABLET | Freq: Every day | ORAL | 0 refills | Status: DC

## 2019-10-15 MED ORDER — VERAPAMIL HCL ER 240 MG PO TBCR: EXTENDED_RELEASE_TABLET | ORAL | 0 refills | Status: DC

## 2019-10-15 NOTE — Addendum Note (Signed)
Addended byAnnamaria Helling on: 10/15/2019 09:08 AM   Modules accepted: Orders

## 2019-10-16 ENCOUNTER — Ambulatory Visit (INDEPENDENT_AMBULATORY_CARE_PROVIDER_SITE_OTHER): Payer: Self-pay | Admitting: Physician Assistant

## 2019-10-16 VITALS — BP 160/100 | HR 112 | Temp 97.0°F | Ht 72.0 in | Wt 310.0 lb

## 2019-10-16 DIAGNOSIS — Z125 Encounter for screening for malignant neoplasm of prostate: Secondary | ICD-10-CM

## 2019-10-16 DIAGNOSIS — Z0000 Encounter for general adult medical examination without abnormal findings: Secondary | ICD-10-CM

## 2019-10-16 DIAGNOSIS — Z79899 Other long term (current) drug therapy: Secondary | ICD-10-CM

## 2019-10-16 DIAGNOSIS — I10 Essential (primary) hypertension: Secondary | ICD-10-CM

## 2019-10-16 DIAGNOSIS — N1831 Chronic kidney disease, stage 3a: Secondary | ICD-10-CM

## 2019-10-16 DIAGNOSIS — E782 Mixed hyperlipidemia: Secondary | ICD-10-CM

## 2019-10-16 DIAGNOSIS — R7989 Other specified abnormal findings of blood chemistry: Secondary | ICD-10-CM

## 2019-10-16 DIAGNOSIS — M109 Gout, unspecified: Secondary | ICD-10-CM

## 2019-10-16 MED ORDER — ALLOPURINOL 300 MG PO TABS: 300.0000 mg | ORAL_TABLET | Freq: Every day | ORAL | 3 refills | Status: AC

## 2019-10-16 MED ORDER — SPIRONOLACTONE 25 MG PO TABS: 25.0000 mg | ORAL_TABLET | Freq: Every day | ORAL | 1 refills | Status: AC

## 2019-10-16 MED ORDER — PREDNISONE 50 MG PO TABS: ORAL_TABLET | ORAL | 0 refills | Status: DC

## 2019-10-16 MED ORDER — SIMVASTATIN 20 MG PO TABS: 20.0000 mg | ORAL_TABLET | Freq: Every day | ORAL | 1 refills | Status: AC

## 2019-10-16 MED ORDER — BENAZEPRIL HCL 40 MG PO TABS: 40.0000 mg | ORAL_TABLET | Freq: Every day | ORAL | 1 refills | Status: AC

## 2019-10-16 MED ORDER — HYDROCHLOROTHIAZIDE 25 MG PO TABS: 25.0000 mg | ORAL_TABLET | Freq: Every day | ORAL | 1 refills | Status: AC

## 2019-10-16 MED ORDER — VERAPAMIL HCL ER 240 MG PO TBCR: EXTENDED_RELEASE_TABLET | ORAL | 1 refills | Status: AC

## 2019-10-16 NOTE — Progress Notes (Signed)
Patient ID: Zachary Ibarra, male   DOB: [birth year], 53 y.o.   MRN: [id number] .Marland KitchenVirtual Visit via Video Note  I connected with Zachary Ibarra on 10/16/19 at  1:00 PM EST by a video enabled telemedicine application and verified that I am speaking with the correct person using two identifiers.  Location: Patient: car Provider: clinic   I discussed the limitations of evaluation and management by telemedicine and the availability of in person appointments. The patient expressed understanding and agreed to proceed.  History of Present Illness: Pt is a 53 yo obese male with HTN, OSA, Graves disease, CKD, Gout, HLD who calls into the clinic for medication refill.   Pt has been out of medication for last 6 days. Pt denies any headache, dizziness, CP, palpitations, vision changes.   His gout has been really controlled and no exacerbations in a long time.   He is not exercising or eating like he should. He does want to work on being healthier.   .. Active Ambulatory Problems    Diagnosis Date Noted  . Gout 06/17/2016  . HTN (hypertension) 06/17/2016  . DDD (degenerative disc disease), lumbar 12/12/2017  . Hyperlipidemia 12/12/2017  . Sleep apnea 12/12/2017  . Graves disease 12/12/2017  . CKD (chronic kidney disease) stage 3, GFR 30-59 ml/min 12/12/2017  . Arthritis of knee, right 12/12/2017  . Morbid obesity (HCC) 01/18/2019   Resolved Ambulatory Problems    Diagnosis Date Noted  . Elevated serum creatinine 06/18/2016   Past Medical History:  Diagnosis Date  . High cholesterol   . Hypertension   . Thyroid disease    Reviewed med, allergy, problem list.     Observations/Objective: No acute distress. Normal mood and appearance.  Normal breathing.   .. Today's Vitals   10/16/19 1123  BP: (!) 160/100  Pulse: (!) 112  Temp: (!) 97 F (36.1 C)  TempSrc: Oral  Weight: (!) 310 lb (140.6 kg)  Height: 6' (1.829 m)   Body mass index is 42.04 kg/m.   Assessment and  Plan: Marland KitchenMarland KitchenDiagnoses and all orders for this visit:  Preventative health care -     Uric acid -     Lipid Panel w/reflex Direct LDL -     COMPLETE METABOLIC PANEL WITH GFR -     CBC w/Diff -     PSA  Essential hypertension -     verapamil (CALAN-SR) 240 MG CR tablet; TAKE 1 TABLET BY MOUTH ONCE DAILY AT BEDTIME -     simvastatin (ZOCOR) 20 MG tablet; Take 1 tablet (20 mg total) by mouth daily. -     hydrochlorothiazide (HYDRODIURIL) 25 MG tablet; Take 1 tablet (25 mg total) by mouth daily. -     benazepril (LOTENSIN) 40 MG tablet; Take 1 tablet (40 mg total) by mouth daily. -     spironolactone (ALDACTONE) 25 MG tablet; Take 1 tablet (25 mg total) by mouth daily. -     CBC w/Diff  Elevated serum creatinine -     benazepril (LOTENSIN) 40 MG tablet; Take 1 tablet (40 mg total) by mouth daily. -     COMPLETE METABOLIC PANEL WITH GFR  Acute gout of ankle, unspecified cause, unspecified laterality -     allopurinol (ZYLOPRIM) 300 MG tablet; Take 1 tablet (300 mg total) by mouth daily. -     predniSONE (DELTASONE) 50 MG tablet; TAKE 1 TABLET BY MOUTH ONCE DAILY AS NEEDED FOR GOUT -     Uric acid  Prostate cancer screening -  PSA  Mixed hyperlipidemia -     simvastatin (ZOCOR) 20 MG tablet; Take 1 tablet (20 mg total) by mouth daily. -     Lipid Panel w/reflex Direct LDL  Stage 3a chronic kidney disease -     COMPLETE METABOLIC PANEL WITH GFR  Medication management -     CBC w/Diff   BP is not to goal. Restart medications. Check BP daily for next 2 weeks and report readings. BP needs to be under 130/90 due to CKD. Discussed risk of not controlling BP. Encouraged weight loss and low salt diet.   Refills sent.   Pt is due for blood work. Ordered today.   Discuss new provider Dr. Zigmund Daniel and encouraged to establish in February.    Follow Up Instructions:    I discussed the assessment and treatment plan with the patient. The patient was provided an opportunity to ask  questions and all were answered. The patient agreed with the plan and demonstrated an understanding of the instructions.   The patient was advised to call back or seek an in-person evaluation if the symptoms worsen or if the condition fails to improve as anticipated.   Iran Planas, PA-C

## 2019-10-17 ENCOUNTER — Encounter: Payer: Self-pay | Admitting: Physician Assistant

## 2019-12-08 ENCOUNTER — Other Ambulatory Visit: Payer: Self-pay | Admitting: Family Medicine

## 2019-12-08 DIAGNOSIS — M109 Gout, unspecified: Secondary | ICD-10-CM

## 2020-10-12 ENCOUNTER — Other Ambulatory Visit: Payer: Self-pay | Admitting: Physician Assistant

## 2020-10-12 DIAGNOSIS — M109 Gout, unspecified: Secondary | ICD-10-CM

## 2020-10-12 DIAGNOSIS — I10 Essential (primary) hypertension: Secondary | ICD-10-CM

## 2020-10-21 ENCOUNTER — Other Ambulatory Visit: Payer: Self-pay | Admitting: *Deleted

## 2020-10-21 DIAGNOSIS — M109 Gout, unspecified: Secondary | ICD-10-CM

## 2020-10-21 MED ORDER — PREDNISONE 50 MG PO TABS: ORAL_TABLET | ORAL | 0 refills | Status: AC

## 2020-12-24 ENCOUNTER — Other Ambulatory Visit: Payer: Self-pay | Admitting: Physician Assistant

## 2020-12-24 DIAGNOSIS — I10 Essential (primary) hypertension: Secondary | ICD-10-CM

## 2020-12-24 DIAGNOSIS — M109 Gout, unspecified: Secondary | ICD-10-CM

## 2020-12-24 DIAGNOSIS — R7989 Other specified abnormal findings of blood chemistry: Secondary | ICD-10-CM
